# Patient Record
Sex: Female | Born: 1970 | Race: White | Hispanic: No | Marital: Married | Smoking: Never smoker
Health system: Southern US, Community
[De-identification: ages and names within clinical notes are randomized; demographics above are authoritative.]

## PROBLEM LIST (undated history)

## (undated) DIAGNOSIS — M7730 Calcaneal spur, unspecified foot: Secondary | ICD-10-CM

## (undated) DIAGNOSIS — F319 Bipolar disorder, unspecified: Secondary | ICD-10-CM

## (undated) DIAGNOSIS — F32A Depression, unspecified: Secondary | ICD-10-CM

## (undated) DIAGNOSIS — D649 Anemia, unspecified: Secondary | ICD-10-CM

## (undated) DIAGNOSIS — F329 Major depressive disorder, single episode, unspecified: Secondary | ICD-10-CM

## (undated) DIAGNOSIS — K219 Gastro-esophageal reflux disease without esophagitis: Secondary | ICD-10-CM

## (undated) DIAGNOSIS — M419 Scoliosis, unspecified: Secondary | ICD-10-CM

## (undated) DIAGNOSIS — M1990 Unspecified osteoarthritis, unspecified site: Secondary | ICD-10-CM

## (undated) DIAGNOSIS — R7303 Prediabetes: Secondary | ICD-10-CM

## (undated) DIAGNOSIS — J45909 Unspecified asthma, uncomplicated: Secondary | ICD-10-CM

## (undated) DIAGNOSIS — T7840XA Allergy, unspecified, initial encounter: Secondary | ICD-10-CM

## (undated) DIAGNOSIS — G43909 Migraine, unspecified, not intractable, without status migrainosus: Secondary | ICD-10-CM

## (undated) DIAGNOSIS — I10 Essential (primary) hypertension: Secondary | ICD-10-CM

## (undated) HISTORY — DX: Allergy, unspecified, initial encounter: T78.40XA

## (undated) HISTORY — DX: Calcaneal spur, unspecified foot: M77.30

## (undated) HISTORY — DX: Anemia, unspecified: D64.9

## (undated) HISTORY — PX: WISDOM TOOTH EXTRACTION: SHX21

## (undated) HISTORY — DX: Unspecified osteoarthritis, unspecified site: M19.90

## (undated) HISTORY — DX: Scoliosis, unspecified: M41.9

## (undated) HISTORY — DX: Depression, unspecified: F32.A

## (undated) HISTORY — DX: Major depressive disorder, single episode, unspecified: F32.9

## (undated) HISTORY — DX: Prediabetes: R73.03

---

## 2009-03-19 ENCOUNTER — Encounter: Admission: RE | Admit: 2009-03-19 | Discharge: 2009-03-19 | Payer: Self-pay | Admitting: Internal Medicine

## 2010-04-06 ENCOUNTER — Encounter: Payer: Self-pay | Admitting: *Deleted

## 2017-01-04 HISTORY — PX: RHINOPLASTY: SUR1284

## 2017-07-13 ENCOUNTER — Other Ambulatory Visit: Payer: Self-pay

## 2017-07-13 ENCOUNTER — Encounter (HOSPITAL_COMMUNITY): Payer: Self-pay | Admitting: Emergency Medicine

## 2017-07-13 ENCOUNTER — Emergency Department (HOSPITAL_COMMUNITY)
Admission: EM | Admit: 2017-07-13 | Discharge: 2017-07-13 | Disposition: A | Discharge status: Home / Self Care | Payer: Self-pay | Attending: Emergency Medicine | Admitting: Emergency Medicine

## 2017-07-13 DIAGNOSIS — F319 Bipolar disorder, unspecified: Secondary | ICD-10-CM | POA: Insufficient documentation

## 2017-07-13 DIAGNOSIS — J45909 Unspecified asthma, uncomplicated: Secondary | ICD-10-CM | POA: Insufficient documentation

## 2017-07-13 DIAGNOSIS — I10 Essential (primary) hypertension: Secondary | ICD-10-CM | POA: Insufficient documentation

## 2017-07-13 DIAGNOSIS — J4520 Mild intermittent asthma, uncomplicated: Secondary | ICD-10-CM

## 2017-07-13 DIAGNOSIS — B353 Tinea pedis: Secondary | ICD-10-CM

## 2017-07-13 HISTORY — DX: Unspecified asthma, uncomplicated: J45.909

## 2017-07-13 HISTORY — DX: Gastro-esophageal reflux disease without esophagitis: K21.9

## 2017-07-13 HISTORY — DX: Migraine, unspecified, not intractable, without status migrainosus: G43.909

## 2017-07-13 HISTORY — DX: Bipolar disorder, unspecified: F31.9

## 2017-07-13 HISTORY — DX: Essential (primary) hypertension: I10

## 2017-07-13 MED ORDER — CLOTRIMAZOLE 1 % EX CREA
TOPICAL_CREAM | Freq: Two times a day (BID) | CUTANEOUS | Status: DC
  Administered 2017-07-13: 03:00:00 via TOPICAL
  Filled 2017-07-13 (×2): qty 15

## 2017-07-13 MED ORDER — ALBUTEROL SULFATE HFA 108 (90 BASE) MCG/ACT IN AERS
2.0000 | INHALATION_SPRAY | RESPIRATORY_TRACT | Status: DC | PRN
  Administered 2017-07-13: 2 via RESPIRATORY_TRACT
  Filled 2017-07-13: qty 6.7

## 2017-07-13 MED ORDER — DOXEPIN HCL 10 MG PO CAPS: 150.0000 mg | ORAL_CAPSULE | Freq: Every day | ORAL | 6 refills | Status: DC

## 2017-07-13 MED ORDER — DOXEPIN HCL 10 MG PO CAPS: 10.0000 mg | ORAL_CAPSULE | Freq: Every day | ORAL | 6 refills | Status: DC

## 2017-07-13 NOTE — ED Provider Notes (Signed)
[facility] EMERGENCY DEPARTMENT Provider Note   CSN: [id number] Arrival date & time: 07/13/17  0144     History   Chief Complaint Chief Complaint  Patient presents with  . Medication Refill    HPI Carolyn Daniels is a 47 y.o. female.  Patient presents to the ER stating that she needs help obtaining her medications.  She recently moved from [place].  She has having difficulty purchasing her medications.  She does not currently have insurance.  She has been out of her doxepin.  She reports that she takes this for her bipolar and is concerned that she will have problems if she does not take it.  Also has a painful rash on the bottom of her left foot.     Past Medical History:  Diagnosis Date  . Asthma   . Bipolar 1 disorder (HCC)   . GERD (gastroesophageal reflux disease)   . Hypertension   . Migraines     There are no active problems to display for this patient.   Past Surgical History:  Procedure Laterality Date  . CESAREAN SECTION    . RHINOPLASTY  01/2017     OB History   None      Home Medications    Prior to Admission medications   Medication Sig Start Date End Date Taking? Authorizing Provider  doxepin (SINEQUAN) 10 MG capsule Take 1 capsule (10 mg total) by mouth at bedtime. 07/13/17   Gilda Crease, MD    Family History History reviewed. No pertinent family history.  Social History Social History   Tobacco Use  . Smoking status: Never Smoker  . Smokeless tobacco: Never Used  Substance Use Topics  . Alcohol use: Not Currently  . Drug use: Not Currently     Allergies   Augmentin [amoxicillin-pot clavulanate]; Depakote er [divalproex sodium er]; Lomotil [diphenoxylate]; Sudafed [pseudoephedrine hcl]; and Topamax [topiramate]   Review of Systems Review of Systems  Skin: Positive for rash.  All other systems reviewed and are negative.    Physical Exam Updated Vital Signs BP 131/74 (BP Location: Left Arm)   Pulse 65    Temp 97.8 F (36.6 C) (Oral)   Resp 20   Ht  (1.651 m)   Wt 90.7 kg (200 lb)   SpO2 99%   BMI 33.28 kg/m   Physical Exam  Constitutional: She is oriented to person, place, and time. She appears well-developed and well-nourished. No distress.  HENT:  Head: Normocephalic and atraumatic.  Right Ear: Hearing normal.  Left Ear: Hearing normal.  Nose: Nose normal.  Mouth/Throat: Oropharynx is clear and moist and mucous membranes are normal.  Eyes: Pupils are equal, round, and reactive to light. Conjunctivae and EOM are normal.  Neck: Normal range of motion. Neck supple.  Cardiovascular: Regular rhythm, S1 normal and S2 normal. Exam reveals no gallop and no friction rub.  No murmur heard. Pulmonary/Chest: Effort normal and breath sounds normal. No respiratory distress. She exhibits no tenderness.  Abdominal: Soft. Normal appearance and bowel sounds are normal. There is no hepatosplenomegaly. There is no tenderness. There is no rebound, no guarding, no tenderness at McBurney's point and negative Murphy's sign. No hernia.  Musculoskeletal: Normal range of motion.  Neurological: She is alert and oriented to person, place, and time. She has normal strength. No cranial nerve deficit or sensory deficit. Coordination normal. GCS eye subscore is 4. GCS verbal subscore is 5. GCS motor subscore is 6.  Skin: Skin is warm, dry and  intact. No rash noted. No cyanosis.  Slightly scab, raised, non-draining lesion plantar aspect of left foot  Psychiatric: She has a normal mood and affect. Her speech is normal and behavior is normal. Thought content normal.  Nursing note and vitals reviewed.    ED Treatments / Results  Labs (all labs ordered are listed, but only abnormal results are displayed) Labs Reviewed - No data to display  EKG None  Radiology No results found.  Procedures Procedures (including critical care time)  Medications Ordered in ED Medications  clotrimazole (LOTRIMIN) 1 %  cream (has no administration in time range)  albuterol (PROVENTIL HFA;VENTOLIN HFA) 108 (90 Base) MCG/ACT inhaler 2 puff (has no administration in time range)     Initial Impression / Assessment and Plan / ED Course  I have reviewed the triage vital signs and the nursing notes.  Pertinent labs & imaging results that were available during my care of the patient were reviewed by me and considered in my medical decision making (see chart for details).     Nonspecific rash on bottom of foot, suspect tinea pedis without signs of bacterial superinfection.  Patient has a history of asthma, no active problems, will provide inhaler.  Patient provided information about local pharmacies that might help her with her medications, prescribed her doxepin.  Final Clinical Impressions(s) / ED Diagnoses   Final diagnoses:  Bipolar 1 disorder (HCC)  Tinea pedis of left foot  Mild intermittent asthma, unspecified whether complicated    ED Discharge Orders        Ordered    doxepin (SINEQUAN) 10 MG capsule  Daily at bedtime,   Status:  Discontinued     07/13/17 0222    doxepin (SINEQUAN) 10 MG capsule  Daily at bedtime     07/13/17 0222       Gilda Crease, MD 07/13/17 [phone]

## 2017-07-13 NOTE — ED Triage Notes (Signed)
Pt states she has been taking Doxepine for 4 yrs and needs meds, pt states she does not have enough money to get her meds, pt states she has been diagnosed as Bi-polar and will get "off her rocker and go bonkers" without the meds

## 2017-09-25 ENCOUNTER — Emergency Department (HOSPITAL_COMMUNITY)
Admission: EM | Admit: 2017-09-25 | Discharge: 2017-09-25 | Disposition: A | Discharge status: Home / Self Care | Payer: Self-pay | Attending: Emergency Medicine | Admitting: Emergency Medicine

## 2017-09-25 DIAGNOSIS — J45909 Unspecified asthma, uncomplicated: Secondary | ICD-10-CM | POA: Insufficient documentation

## 2017-09-25 DIAGNOSIS — F4329 Adjustment disorder with other symptoms: Secondary | ICD-10-CM

## 2017-09-25 DIAGNOSIS — F3112 Bipolar disorder, current episode manic without psychotic features, moderate: Secondary | ICD-10-CM | POA: Insufficient documentation

## 2017-09-25 DIAGNOSIS — Z79899 Other long term (current) drug therapy: Secondary | ICD-10-CM | POA: Insufficient documentation

## 2017-09-25 DIAGNOSIS — E876 Hypokalemia: Secondary | ICD-10-CM | POA: Insufficient documentation

## 2017-09-25 DIAGNOSIS — F309 Manic episode, unspecified: Secondary | ICD-10-CM

## 2017-09-25 DIAGNOSIS — I10 Essential (primary) hypertension: Secondary | ICD-10-CM | POA: Insufficient documentation

## 2017-09-25 LAB — CBC
HEMATOCRIT: 41.7 % (ref 36.0–46.0)
HEMOGLOBIN: 13.6 g/dL (ref 12.0–15.0)
MCH: 30.8 pg (ref 26.0–34.0)
MCHC: 32.6 g/dL (ref 30.0–36.0)
MCV: 94.3 fL (ref 78.0–100.0)
Platelets: 407 10*3/uL — ABNORMAL HIGH (ref 150–400)
RBC: 4.42 MIL/uL (ref 3.87–5.11)
RDW: 12.7 % (ref 11.5–15.5)
WBC: 10.6 10*3/uL — AB (ref 4.0–10.5)

## 2017-09-25 LAB — COMPREHENSIVE METABOLIC PANEL
ALK PHOS: 97 U/L (ref 38–126)
ALT: 19 U/L (ref 0–44)
AST: 16 U/L (ref 15–41)
Albumin: 4.5 g/dL (ref 3.5–5.0)
Anion gap: 12 (ref 5–15)
BILIRUBIN TOTAL: 0.6 mg/dL (ref 0.3–1.2)
BUN: 16 mg/dL (ref 6–20)
CO2: 29 mmol/L (ref 22–32)
Calcium: 9.9 mg/dL (ref 8.9–10.3)
Chloride: 101 mmol/L (ref 98–111)
Creatinine, Ser: 0.83 mg/dL (ref 0.44–1.00)
GFR calc Af Amer: >60 mL/min (ref >60)
GFR calc non Af Amer: >60 mL/min (ref >60)
GLUCOSE: 109 mg/dL — AB (ref 70–99)
POTASSIUM: 3 mmol/L — AB (ref 3.5–5.1)
SODIUM: 142 mmol/L (ref 135–145)
TOTAL PROTEIN: 8 g/dL (ref 6.5–8.1)

## 2017-09-25 LAB — RAPID URINE DRUG SCREEN, HOSP PERFORMED
AMPHETAMINES: NOT DETECTED
BARBITURATES: NOT DETECTED
BENZODIAZEPINES: NOT DETECTED
COCAINE: NOT DETECTED
Opiates: NOT DETECTED
TETRAHYDROCANNABINOL: NOT DETECTED

## 2017-09-25 LAB — SALICYLATE LEVEL: Salicylate Lvl: <7 mg/dL (ref 2.8–30.0)

## 2017-09-25 LAB — I-STAT BETA HCG BLOOD, ED (MC, WL, AP ONLY)

## 2017-09-25 LAB — ACETAMINOPHEN LEVEL

## 2017-09-25 LAB — ETHANOL: Alcohol, Ethyl (B): <10 mg/dL (ref <10)

## 2017-09-25 MED ORDER — RISPERIDONE 1 MG PO TABS: 1.0000 mg | ORAL_TABLET | Freq: Every day | ORAL | Status: DC

## 2017-09-25 MED ORDER — ESCITALOPRAM OXALATE 10 MG PO TABS
20.0000 mg | ORAL_TABLET | Freq: Every day | ORAL | Status: DC
  Administered 2017-09-25: 20 mg via ORAL
  Filled 2017-09-25: qty 2

## 2017-09-25 MED ORDER — LORAZEPAM 1 MG PO TABS
1.0000 mg | ORAL_TABLET | ORAL | Status: DC | PRN
  Administered 2017-09-25: 1 mg via ORAL
  Filled 2017-09-25: qty 1

## 2017-09-25 MED ORDER — POTASSIUM CHLORIDE CRYS ER 20 MEQ PO TBCR
40.0000 meq | EXTENDED_RELEASE_TABLET | Freq: Once | ORAL | Status: AC
  Administered 2017-09-25: 40 meq via ORAL
  Filled 2017-09-25: qty 2

## 2017-09-25 MED ORDER — HYDROCHLOROTHIAZIDE 12.5 MG PO CAPS
12.5000 mg | ORAL_CAPSULE | Freq: Every day | ORAL | Status: DC
  Administered 2017-09-25: 12.5 mg via ORAL
  Filled 2017-09-25: qty 1

## 2017-09-25 NOTE — [facility] Suicide Risk Assessment (Cosign Needed Addendum)
Suicide Risk Assessment  Discharge Assessment   [facility]   Principal Problem: Adjustment disorder with emotional disturbance Discharge Diagnoses:  Patient Active Problem List   Diagnosis Date Noted  . Adjustment disorder with emotional disturbance [F43.29] 09/25/2017  ID: Lives with her husband of 5.5 years. States this is his her 3 rd marriage. She has a set of twins (18) from previous marriage. She is unemployed at this time and endorse financial problems at this time.   CC: I needed to get meds and I was hungry. My car wouldn't start. She denies past psychiatric istory, inpatient, outpatient, suicide attempts. She denies family hsitory. She denies previous drug use. UDS negative.   Total Time spent with patient: 20 minutes  Musculoskeletal: Strength & Muscle Tone: within normal limits Gait & Station: normal Patient leans: N/A  Psychiatric Specialty Exam:   Blood pressure 137/77, pulse 81, temperature 97.7 F (36.5 C), temperature source Oral, resp. rate 19, SpO2 99 %.There is no height or weight on file to calculate BMI.  General Appearance: Fairly Groomed  Patent attorneyye Contact::  fair,   Speech:  Clear and Coherent and Pressured  Volume:  Normal  Mood:  better  Affect:  Appropriate and Congruent  Thought Process:  Coherent, Linear and Descriptions of Associations: Circumstantial  Orientation:  Full (Time, Place, and Person)  Thought Content:  Logical, Rumination and Tangential  Suicidal Thoughts:  No  Homicidal Thoughts:  No  Memory:  Immediate;   Good Recent;   Good  Judgement:  Fair  Insight:  Fair  Psychomotor Activity:  Normal  Concentration:  Good  Recall:  Good  Fund of Knowledge:Good  Language: Good  Akathisia:  No  Handed:  Right  AIMS (if indicated):     Assets:  Communication Skills Desire for Improvement Housing Leisure Time Physical Health Transportation Vocational/Educational  Sleep:     Cognition: WNL  ADL's:  Intact    Mental Status Per Nursing Assessment::   On Admission:     Demographic Factors:  Caucasian and Low socioeconomic status  Loss Factors: Decrease in vocational status and Financial problems/change in socioeconomic status  Historical Factors: NA  Risk Reduction Factors:   Sense of responsibility to family, Living with another person, especially a relative and Positive coping skills or problem solving skills  Continued Clinical Symptoms:  Depression:   Impulsivity Insomnia More than one psychiatric diagnosis Previous Psychiatric Diagnoses and Treatments  Cognitive Features That Contribute To Risk:  None    Suicide Risk:  Minimal: No identifiable suicidal ideation.  Patients presenting with no risk factors but with morbid ruminations; may be classified as minimal risk based on the severity of the depressive symptoms   Plan Of Care/Follow-up recommendations:  Other:  Continue taking medications as directed. See information for referral to therapist.   Truman Haywardakia S Starkes, FNP 09/25/2017, 12:15 PM

## 2017-09-25 NOTE — BH Assessment (Addendum)
Assessment Note  Carolyn Daniels is an 47 y.o. female.  The pt came in after making statements to 911 that she want's to kill her exhusband and herself.  The pt stated she said that because, she was hungry and didn't have any money.  The pt lives in [place], [place] and drove down to [place] after being told that someone picked up her husband's medication from a [facility] in [place].  The pt came to [facility] to [facility] to get the medication.  The pt eventually went to [facility] and stated her car stopped working.  The pt stated she did not have any money on any of her cards, so she called her ex husband for assistance.  She became angry after he refused to help her.  The pt was assessed at 0610 and she is very alert and was laughing inappropriately, such as laughing about suicide. Before and after being assessed, the pt was in her doorway talking to people. According to the pt she sleeps from 0800-1500 due to taking care of her disabled husband.  The pt does not appear drowsy at this time. The pt continues to deny SI and HI.  The pt has a psychiatrist in [place], [place] and denies having a counselor currently.  She denies ever being inpatient.  She denies every having SI and self harm.  She also denies HI, legal issues, history of abuse and hallucinations.  The pt stated she has a good appetite.  She denies depressive symptoms.  She denies any past or present substance use.   Pt is dressed in scrubs. She is alert and oriented x4. Pt speaks in a clear tone, at moderate volume and normal pace.  The pt is tangential and laughs inappropriately. Eye contact is good. Pt's mood is euphoric. There is no indication Pt is currently responding to internal stimuli or experiencing delusional thought content.?Pt was cooperative throughout assessment.    Diagnosis: F31.12 Bipolar I disorder, Current or most recent episode manic, Moderate   Past Medical History:  Past Medical History:  Diagnosis Date  . Asthma   . Bipolar 1 disorder  (HCC)   . GERD (gastroesophageal reflux disease)   . Hypertension   . Migraines     Past Surgical History:  Procedure Laterality Date  . CESAREAN SECTION    . RHINOPLASTY  01/2017    Family History: No family history on file.  Social History:  reports that she has never smoked. She has never used smokeless tobacco. She reports that she drank alcohol. She reports that she has current or past drug history.  Additional Social History:  Alcohol / Drug Use Pain Medications: See MAR Prescriptions: See MAR Over the Counter: See MAR History of alcohol / drug use?: No history of alcohol / drug abuse Longest period of sobriety (when/how long): NA  CIWA: CIWA-Ar BP: 137/77 Pulse Rate: 81 COWS:    Allergies:  Allergies  Allergen Reactions  . Augmentin [Amoxicillin-Pot Clavulanate] Other (See Comments)    c diff  . Depakote Er [Divalproex Sodium Er] Other (See Comments)    "extreme gas pain"  . Lamictal [Lamotrigine] Hives  . Sudafed [Pseudoephedrine Hcl] Other (See Comments)    Tachycardia   . Topamax [Topiramate] Other (See Comments)    Nose bleeds    Home Medications:  (Not in a hospital admission)  OB/GYN Status:  No LMP recorded.  General Assessment Data Location of Assessment: [facility] ED TTS Assessment: In system Is this a Tele or Face-to-Face Assessment?:  Face-to-Face Is this an Initial Assessment or a Re-assessment for this encounter?: Initial Assessment Marital status: Married [place]iden name: Corine ShelterWatkins Is patient pregnant?: No Pregnancy Status: No Living Arrangements: Spouse/significant other Can pt return to current living arrangement?: Yes Admission Status: Involuntary Is patient capable of signing voluntary admission?: No(Pt IVC) Referral Source: Self/Family/Friend Insurance type: Self Pay  Medical Screening Exam [facility] Walk-in ONLY) Medical Exam completed: Yes  Crisis Care Plan Living Arrangements: Spouse/significant other Legal Guardian: Other:(Self) Name of  Psychiatrist: Dr. Alessandra [place]sVaughn in [place] Name of Therapist: none  Education Status Is patient currently in school?: No Is the patient employed, unemployed or receiving disability?: ("self employed")  Risk to self with the past 6 months Suicidal Ideation: No Has patient been a risk to self within the past 6 months prior to admission? : No Suicidal Intent: No Has patient had any suicidal intent within the past 6 months prior to admission? : No Is patient at risk for suicide?: No Suicidal Plan?: No Has patient had any suicidal plan within the past 6 months prior to admission? : No Access to Means: No What has been your use of drugs/alcohol within the last 12 months?: none Previous Attempts/Gestures: No How many times?: 0 Other Self Harm Risks: none Triggers for Past Attempts: None known Intentional Self Injurious Behavior: None Family Suicide History: No Recent stressful life event(s): Loss (Comment), Conflict (Comment)(lack of money and argument with ex husband) Persecutory voices/beliefs?: No Depression: No Substance abuse history and/or treatment for substance abuse?: No Suicide prevention information given to non-admitted patients: Not applicable  Risk to Others within the past 6 months Homicidal Ideation: No Does patient have any lifetime risk of violence toward others beyond the six months prior to admission? : No Thoughts of Harm to Others: No Current Homicidal Intent: No Current Homicidal Plan: No Access to Homicidal Means: No Identified Victim: none History of harm to others?: No Assessment of Violence: None Noted Violent Behavior Description: none Does patient have access to weapons?: No Criminal Charges Pending?: No Does patient have a court date: No Is patient on probation?: No  Psychosis Hallucinations: None noted Delusions: None noted  Mental Status Report Appearance/Hygiene: In scrubs, Unremarkable Eye Contact: Good Motor Activity: Freedom of movement,  Unremarkable Speech: Logical/coherent Level of Consciousness: Alert Mood: Euphoric Affect: Euphoric Anxiety Level: None Thought Processes: Coherent, Relevant, Tangential Judgement: Impaired Orientation: Person, Place, Time, Situation Obsessive Compulsive Thoughts/Behaviors: None  Cognitive Functioning Concentration: Normal Memory: Recent Intact, Remote Intact Is patient IDD: No Is patient DD?: No Insight: Poor Impulse Control: Poor Appetite: Good Have you had any weight changes? : No Change Sleep: Unable to Assess Total Hours of Sleep: 8(per pt's report  Pt doesn't appear sleepy & hasn't slept ) Vegetative Symptoms: None  ADLScreening [facility] Assessment Services) Patient's cognitive ability adequate to safely complete daily activities?: Yes Patient able to express need for assistance with ADLs?: Yes Independently performs ADLs?: Yes (appropriate for developmental age)  Prior Inpatient Therapy Prior Inpatient Therapy: No  Prior Outpatient Therapy Prior Outpatient Therapy: Yes Prior Therapy Dates: current Prior Therapy Facilty/Provider(s): Dr. Alessandra BevelsVaughn in [place] Reason for Treatment: bipolar disorder Does patient have an ACCT team?: No Does patient have Intensive In-House Services?  : No Does patient have [facility] services? : No Does patient have P4CC services?: No  ADL Screening (condition at time of admission) Patient's cognitive ability adequate to safely complete daily activities?: Yes Patient able to express need for assistance with ADLs?: Yes Independently performs ADLs?: Yes (appropriate for developmental age)  Abuse/Neglect Assessment (Assessment to be complete while patient is alone) Abuse/Neglect Assessment Can Be Completed: Yes Physical Abuse: Denies Verbal Abuse: Denies Sexual Abuse: Denies Exploitation of patient/patient's resources: Denies Self-Neglect: Denies Values / Beliefs Cultural Requests During Hospitalization: None Spiritual Requests During  Hospitalization: None Consults Spiritual Care Consult Needed: No Social Work Consult Needed: No            Disposition:  Disposition Initial Assessment Completed for this Encounter: Yes   NP Nira Conn recommends the pt be observed overnight for safety and stabilization.  The pt is to be reassessed by psychiatry in the morning.  RN Junior and MD Bebe Shaggy were made aware of the recommendation.   On Site Evaluation by:   Reviewed with Physician:    Ottis Stain 09/25/2017 6:35 AM

## 2017-09-25 NOTE — ED Notes (Signed)
Bed: WA30 Expected date:  Expected time:  Means of arrival:  Comments: EMS SI 

## 2017-09-25 NOTE — ED Triage Notes (Addendum)
Per [employer] patient called them stated she wants to kill herself and her husband. Per [employer] pt was been in [facility] for 12 hours. Patient stated "I just said Im suicidal because I don't have money and Im hungry".Patient IVC'd by [employer]

## 2017-09-25 NOTE — Discharge Instructions (Signed)
For your behavioral health needs, you are advised to [facility] with [facility]:       [facility]      [address]      [place], [place] [address]      [phone]

## 2017-09-25 NOTE — Progress Notes (Addendum)
CSW met with patient regarding disposition plan. Patient has been psychiatrically cleared and is ready to discharge today. Patient currently lives in [place], [place] but her car is still at a [employer] in [place], [place] and is out of gas. Patient states that she has family that can lend her some money over 35 to fill up her car. Patient states she does not have anyone who can pick her up and take her to her car. CSW informed patient that she can provide her with a bus pass. Patient states that in the event that she is unable to fill up her car she can reach out to the pastor of her church who can come pick her up. CSW has left bus pass with RN.  Ollen Barges, [place] Work Department  [employer]  [phone]

## 2017-09-25 NOTE — ED Provider Notes (Signed)
[facility]-EMERGENCY DEPT Provider Note   CSN: [id number] Arrival date & time: 09/25/17  0446     History   Chief Complaint Chief Complaint  Patient presents with  . Suicidal  . IVC   Level 5 caveat due to psychiatric disorder HPI Carol[facility] is a 47 y.o. female.  The history is provided by the patient.  Mental Health Problem  Presenting symptoms: bizarre behavior, disorganized speech and suicidal threats   Degree of incapacity (severity):  Severe Onset quality:  Gradual Timing:  Constant Progression:  Worsening Chronicity:  New Relieved by:  Nothing Worsened by:  Nothing Associated symptoms: no headaches   patient with history of bipolar presents for evaluation. Apparently she was staying at a local pharmacy for several hours because she could not get her ride home.  She then reported that she was suicidal and did not have any money and she was hungry.  This was reason police were called and she was brought to the emergency department for evaluation  When I enter the room, patient is smiling and laughing.  She asked me if I understood what " the straw that broke the camel's back" means.  She also asked me about religion.  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  Past Medical History:  Diagnosis Date  . Asthma   . Bipolar 1 disorder (HCC)   . GERD (gastroesophageal reflux disease)   . Hypertension   . Migraines     There are no active problems to display for this patient.   Past Surgical History:  Procedure Laterality Date  . CESAREAN SECTION    . RHINOPLASTY  01/2017     OB History   None      Home Medications    Prior to Admission medications   Medication Sig Start Date End Date Taking? Authorizing Provider  cetirizine (ZYRTEC) 10 MG tablet Take 10 mg by mouth daily.   Yes [provider]  cholecalciferol (VITAMIN D) 1000 units tablet Take 1,000 Units by mouth daily.   Yes  [provider]  Cyanocobalamin (VITAMIN B 12 PO) Take 1 tablet by mouth daily.   Yes [provider]  escitalopram (LEXAPRO) 20 MG tablet Take 20 mg by mouth daily.   Yes [provider]  hydrochlorothiazide (MICROZIDE) 12.5 MG capsule Take 12.5 mg by mouth daily.   Yes [provider]  losartan (COZAAR) 25 MG tablet Take 25 mg by mouth daily.   Yes [provider]  montelukast (SINGULAIR) 10 MG tablet Take 10 mg by mouth at bedtime.   Yes [provider]  Multiple Vitamin (MULTIVITAMIN WITH MINERALS) TABS tablet Take 1 tablet by mouth daily.   Yes [provider]  propranolol ER (INDERAL LA) 80 MG 24 hr capsule Take 80 mg by mouth daily.   Yes [provider]  ranitidine (ZANTAC) 300 MG tablet Take 600 mg by mouth 2 (two) times daily.   Yes [provider]  risperiDONE (RISPERDAL) 1 MG tablet Take 1 mg by mouth at bedtime.   Yes [provider]  doxepin (SINEQUAN) 10 MG capsule Take 1 capsule (10 mg total) by mouth at bedtime. Patient not taking: Reported on 09/25/2017 07/13/17   Gilda Crease, MD    Family History No family history on file.  Social History Social History   Tobacco Use  . Smoking status: Never Smoker  . Smokeless tobacco: Never Used  Substance Use Topics  . Alcohol use: Not Currently  . Drug use: Not Currently     Allergies   Augmentin [amoxicillin-pot clavulanate]; Depakote er [divalproex sodium er]; Lamictal [lamotrigine]; Sudafed [pseudoephedrine hcl]; and Topamax [topiramate]   Review of Systems Review of Systems  Unable to perform ROS: Psychiatric disorder  Neurological: Negative for headaches.     Physical Exam Updated Vital Signs BP 137/77 (BP Location: Right Arm)   Pulse 81   Temp 97.7 F (36.5 C) (Oral)   Resp 19   SpO2 99%   Physical Exam CONSTITUTIONAL: Disheveled, smiling and pacing around the room HEAD: Normocephalic/atraumatic EYES:  EOMI ENMT: Mucous membranes moist NECK: supple no meningeal signs CV: S1/S2 noted, no murmurs/rubs/gallops noted LUNGS: Lungs are clear to auscultation bilaterally, no apparent distress ABDOMEN: soft NEURO: Pt is awake/alert moves all extremitiesx4.  No facial droop.  She is ambulatory EXTREMITIES:  full ROM SKIN: warm, color normal PSYCH: Patient is anxious, smiling and laughing inappropriately.  She has pressured speech and flight of ideas  ED Treatments / Results  Labs (all labs ordered are listed, but only abnormal results are displayed) Labs Reviewed  COMPREHENSIVE METABOLIC PANEL - Abnormal; Notable for the following components:      Result Value   Potassium 3.0 (*)    Glucose, Bld 109 (*)    All other components within normal limits  ACETAMINOPHEN LEVEL - Abnormal; Notable for the following components:   Acetaminophen (Tylenol), Serum <10 (*)    All other components within normal limits  CBC - Abnormal; Notable for the following components:   WBC 10.6 (*)    Platelets 407 (*)    All other components within normal limits  ETHANOL  SALICYLATE LEVEL  RAPID URINE DRUG SCREEN, HOSP PERFORMED  I-STAT BETA HCG BLOOD, ED ([facility], [facility], [facility] ONLY)    EKG None  Radiology No results found.  Procedures Procedures   Medications Ordered in ED Medications  escitalopram (LEXAPRO) tablet 20 mg (has no administration in time range)  hydrochlorothiazide (MICROZIDE) capsule 12.5 mg (has no administration in time range)  risperiDONE (RISPERDAL) tablet 1 mg (has no administration in time range)  LORazepam (ATIVAN) tablet 1 mg (1 mg Oral Given 09/25/17 0616)  potassium chloride SA (K-DUR,KLOR-CON) CR tablet 40 mEq (40 mEq Oral Given 09/25/17 [id number])     Initial Impression / Assessment and Plan / ED Course  I have reviewed the triage vital signs and the nursing notes.  Pertinent labs  results that were available during my care of the patient were reviewed by me and considered in my medical  decision making (see chart for details).     6:44 AM Strong suspicion this represents a manic episode.  Home medications have been ordered.  Mild hypokalemia noted, she has been ordered to have potassium.  Patient will be evaluated by psychiatry.  Patient medically stable  Final Clinical Impressions(s) / ED Diagnoses   Final diagnoses:  [facility](HCC)    ED Discharge Orders    None       Zadie RhineWickline, Adison Reifsteck, MD 09/25/17 [id number]

## 2017-09-25 NOTE — ED Notes (Signed)
Bed: WBH35 Expected date:  Expected time:  Means of arrival:  Comments: Hold for room 30 

## 2017-09-25 NOTE — BH Assessment (Signed)
[name]      Per Dr Sharma CovertNorman, patient can be discharged home to follow up with outpatient resources.

## 2017-09-25 NOTE — ED Notes (Signed)
On admission to the Acute Unit pt is cheerful and cooperative.

## 2017-09-25 NOTE — BH Assessment (Signed)
[facility]  Per Juanetta BeetsJacqueline [name], DO, this pt does not require psychiatric hospitalization at this time.  Pt presents under IVC initiated by law enforcement, which Dr Sharma CovertNorman has rescinded.  [facility] [place].  This has been included in pt's discharge instructions.  Thea SilversmithMackenzie, LCSW agrees to address pt's transportation needs to return home.  Pt's nurse, Diane, has been notified.  Doylene Canninghomas Vernel Donlan, MA Triage Specialist [phone]

## 2017-09-25 NOTE — ED Notes (Signed)
Pt discharged home. Discharged instructions read to pt who verbalized understanding. All belongings returned to pt who signed for same. Denies SI/HI, is not delusional and not responding to internal stimuli. Escorted pt to the ED exit.  Pt given bus pass. 
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
## 2017-09-25 NOTE — ED Notes (Signed)
BELONGINGS INCLUDE DENIM & BROWN PURSE; BLACK SHIRT; 1 BRA; BLUE SANDALS; COLORFUL SHORTS; VARIOUS CARDS IN CARD CASES; 1 EMPTY BLACK WALLET; 1 BLACK PHONE; MIRROR; KEYS; BOTTLE OF FENOFIBRATE 145 MG; BLACK & SILVER COLOR EARRINGS,  1 GOLD COLOR WATCH, 8 RING'S, 1 INHALER, 1 SILVER COLOR BRACELET. ALL IN LOCKER 35.

## 2017-11-26 ENCOUNTER — Ambulatory Visit: Payer: Self-pay | Admitting: Physician Assistant

## 2017-12-03 ENCOUNTER — Ambulatory Visit: Payer: Medicaid Other | Admitting: Physician Assistant

## 2017-12-03 ENCOUNTER — Encounter: Payer: Self-pay | Admitting: Physician Assistant

## 2017-12-03 VITALS — BP 124/77 | HR 88 | Temp 97.5°F | Ht 64.25 in | Wt 191.0 lb

## 2017-12-03 DIAGNOSIS — Z8639 Personal history of other endocrine, nutritional and metabolic disease: Secondary | ICD-10-CM

## 2017-12-03 DIAGNOSIS — Z1239 Encounter for other screening for malignant neoplasm of breast: Secondary | ICD-10-CM

## 2017-12-03 DIAGNOSIS — R7989 Other specified abnormal findings of blood chemistry: Secondary | ICD-10-CM

## 2017-12-03 DIAGNOSIS — I10 Essential (primary) hypertension: Secondary | ICD-10-CM | POA: Insufficient documentation

## 2017-12-03 DIAGNOSIS — Z131 Encounter for screening for diabetes mellitus: Secondary | ICD-10-CM

## 2017-12-03 DIAGNOSIS — F319 Bipolar disorder, unspecified: Secondary | ICD-10-CM | POA: Insufficient documentation

## 2017-12-03 DIAGNOSIS — G43C0 Periodic headache syndromes in child or adult, not intractable: Secondary | ICD-10-CM

## 2017-12-03 DIAGNOSIS — E876 Hypokalemia: Secondary | ICD-10-CM

## 2017-12-03 DIAGNOSIS — Z7689 Persons encountering health services in other specified circumstances: Secondary | ICD-10-CM

## 2017-12-03 DIAGNOSIS — Z13220 Encounter for screening for lipoid disorders: Secondary | ICD-10-CM

## 2017-12-03 MED ORDER — LOSARTAN POTASSIUM 25 MG PO TABS: 25.0000 mg | ORAL_TABLET | Freq: Every day | ORAL | 1 refills | Status: DC
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
## 2017-12-03 NOTE — Patient Instructions (Signed)
For [place] [facility]: -proof of address -spouse's Notice of Award -Spouse's most recent benefit letter

## 2017-12-10 ENCOUNTER — Other Ambulatory Visit: Payer: Self-pay | Admitting: Physician Assistant

## 2017-12-10 MED ORDER — MONTELUKAST SODIUM 10 MG PO TABS: 10.0000 mg | ORAL_TABLET | Freq: Every day | ORAL | 1 refills | Status: DC

## 2017-12-20 ENCOUNTER — Other Ambulatory Visit (HOSPITAL_COMMUNITY)
Admission: RE | Admit: 2017-12-20 | Discharge: 2017-12-20 | Disposition: A | Discharge status: Home / Self Care | Payer: Medicaid Other | Source: Ambulatory Visit | Attending: Physician Assistant | Admitting: Physician Assistant

## 2017-12-20 DIAGNOSIS — E876 Hypokalemia: Secondary | ICD-10-CM

## 2017-12-20 DIAGNOSIS — R7989 Other specified abnormal findings of blood chemistry: Secondary | ICD-10-CM

## 2017-12-20 DIAGNOSIS — Z13220 Encounter for screening for lipoid disorders: Secondary | ICD-10-CM

## 2017-12-20 DIAGNOSIS — I10 Essential (primary) hypertension: Secondary | ICD-10-CM

## 2017-12-20 DIAGNOSIS — Z8639 Personal history of other endocrine, nutritional and metabolic disease: Secondary | ICD-10-CM

## 2017-12-20 DIAGNOSIS — Z131 Encounter for screening for diabetes mellitus: Secondary | ICD-10-CM

## 2017-12-20 LAB — LIPID PANEL
CHOL/HDL RATIO: 4.2 ratio
CHOLESTEROL: 211 mg/dL — AB (ref 0–200)
HDL: 50 mg/dL (ref >40)
LDL Cholesterol: 140 mg/dL — ABNORMAL HIGH (ref 0–99)
Triglycerides: 106 mg/dL (ref <150)
VLDL: 21 mg/dL (ref 0–40)

## 2017-12-20 LAB — COMPREHENSIVE METABOLIC PANEL
ALT: 20 U/L (ref 0–44)
AST: 19 U/L (ref 15–41)
Albumin: 4.2 g/dL (ref 3.5–5.0)
Alkaline Phosphatase: 79 U/L (ref 38–126)
Anion gap: 9 (ref 5–15)
BILIRUBIN TOTAL: 0.7 mg/dL (ref 0.3–1.2)
BUN: 10 mg/dL (ref 6–20)
CO2: 24 mmol/L (ref 22–32)
Calcium: 9.1 mg/dL (ref 8.9–10.3)
Chloride: 102 mmol/L (ref 98–111)
Creatinine, Ser: 0.69 mg/dL (ref 0.44–1.00)
Glucose, Bld: 113 mg/dL — ABNORMAL HIGH (ref 70–99)
POTASSIUM: 3.6 mmol/L (ref 3.5–5.1)
Sodium: 135 mmol/L (ref 135–145)
TOTAL PROTEIN: 7.5 g/dL (ref 6.5–8.1)

## 2017-12-20 LAB — CBC
HCT: 42.2 % (ref 36.0–46.0)
HEMOGLOBIN: 13.7 g/dL (ref 12.0–15.0)
MCH: 30.3 pg (ref 26.0–34.0)
MCHC: 32.5 g/dL (ref 30.0–36.0)
MCV: 93.4 fL (ref 80.0–100.0)
Platelets: 421 10*3/uL — ABNORMAL HIGH (ref 150–400)
RBC: 4.52 MIL/uL (ref 3.87–5.11)
RDW: 12.1 % (ref 11.5–15.5)
WBC: 9.2 10*3/uL (ref 4.0–10.5)
nRBC: 0 % (ref 0.0–0.2)

## 2017-12-20 LAB — HEMOGLOBIN A1C
HEMOGLOBIN A1C: 5.8 % — AB (ref 4.8–5.6)
MEAN PLASMA GLUCOSE: 119.76 mg/dL

## 2017-12-21 LAB — VITAMIN D 25 HYDROXY (VIT D DEFICIENCY, FRACTURES): Vit D, 25-Hydroxy: 25.7 ng/mL — ABNORMAL LOW (ref 30.0–100.0)

## 2017-12-29 ENCOUNTER — Other Ambulatory Visit: Payer: Self-pay

## 2017-12-29 ENCOUNTER — Encounter (HOSPITAL_COMMUNITY): Payer: Self-pay

## 2017-12-29 DIAGNOSIS — Z79899 Other long term (current) drug therapy: Secondary | ICD-10-CM | POA: Insufficient documentation

## 2017-12-29 DIAGNOSIS — G4489 Other headache syndrome: Secondary | ICD-10-CM | POA: Insufficient documentation

## 2017-12-29 DIAGNOSIS — J45909 Unspecified asthma, uncomplicated: Secondary | ICD-10-CM | POA: Insufficient documentation

## 2017-12-29 DIAGNOSIS — I10 Essential (primary) hypertension: Secondary | ICD-10-CM | POA: Insufficient documentation

## 2017-12-29 NOTE — ED Triage Notes (Signed)
Pt reports she always has headaches, but was on Propanolol and felt that it controlled headaches to a tolerable level, however has not taken since June due to financial reasons. Pt says headache today has gotten worse, no improvement with tylenol.

## 2017-12-30 ENCOUNTER — Emergency Department (HOSPITAL_COMMUNITY)
Admission: EM | Admit: 2017-12-30 | Discharge: 2017-12-30 | Disposition: A | Discharge status: Home / Self Care | Payer: Medicaid Other | Attending: Emergency Medicine | Admitting: Emergency Medicine

## 2017-12-30 DIAGNOSIS — G4489 Other headache syndrome: Secondary | ICD-10-CM

## 2017-12-30 MED ORDER — PROPRANOLOL HCL 10 MG PO TABS: 60.0000 mg | ORAL_TABLET | Freq: Once | ORAL | Status: DC

## 2017-12-30 MED ORDER — PROPRANOLOL HCL ER 60 MG PO CP24: 60.0000 mg | ORAL_CAPSULE | Freq: Every day | ORAL | 0 refills | Status: DC

## 2017-12-30 MED ORDER — PROPRANOLOL HCL 10 MG PO TABS
20.0000 mg | ORAL_TABLET | Freq: Once | ORAL | Status: AC
  Administered 2017-12-30: 20 mg via ORAL
  Filled 2017-12-30: qty 2

## 2017-12-30 NOTE — ED Provider Notes (Signed)
[facility] EMERGENCY DEPARTMENT Provider Note   CSN: [id number] Arrival date & time: 12/29/17  2132     History   Chief Complaint Chief Complaint  Patient presents with  . Headache    HPI Carolyn Daniels is a 47 y.o. female.  The history is provided by the patient.  Headache   This is a new problem. The current episode started 3 to 5 hours ago. The problem occurs constantly. The problem has been gradually improving. The headache is associated with nothing. The pain is located in the frontal region. The quality of the pain is described as throbbing. The pain is moderate. The pain does not radiate. Pertinent negatives include no fever and no vomiting.   Patient presents for headache.  She reports a long history of migraines, usually has 1 each month.  This episode started several hours ago.  It is in the frontal region, no fevers or vomiting. The headache was sudden onset, similar to prior episodes.  She used to take propranolol for migraines, but reports it has become too expensive. No new vision changes.  No new weakness. No other acute symptoms at this time Past Medical History:  Diagnosis Date  . Allergy   . Anemia   . Arthritis   . Asthma   . Bipolar 1 disorder (HCC)   . Depression   . GERD (gastroesophageal reflux disease)   . Heel spur   . Hypertension   . Migraines   . Migraines   . Pre-diabetes   . Scoliosis     Patient Active Problem List   Diagnosis Date Noted  . Essential hypertension 12/03/2017  . Bipolar affective disorder (HCC) 12/03/2017  . Adjustment disorder with emotional disturbance 09/25/2017    Past Surgical History:  Procedure Laterality Date  . CESAREAN SECTION    . RHINOPLASTY  01/2017  . WISDOM TOOTH EXTRACTION Bilateral      OB History   None      Home Medications    Prior to Admission medications   Medication Sig Start Date End Date Taking? Authorizing Provider  cetirizine (ZYRTEC) 10 MG tablet Take 10 mg by mouth  daily.    [provider]  hydrochlorothiazide (MICROZIDE) 12.5 MG capsule Take 12.5 mg by mouth daily.    [provider]  losartan (COZAAR) 25 MG tablet Take 1 tablet (25 mg total) by mouth daily. 12/03/17   Jacquelin Hawking, PA-C  montelukast (SINGULAIR) 10 MG tablet Take 10 mg by mouth at bedtime.    [provider]  montelukast (SINGULAIR) 10 MG tablet Take 1 tablet (10 mg total) by mouth at bedtime. 12/10/17   Jacquelin Hawking, PA-C  Multiple Vitamin (MULTIVITAMIN WITH MINERALS) TABS tablet Take 1 tablet by mouth daily.    [provider]  Probiotic Product (PROBIOTIC DAILY PO) Take 1 tablet by mouth daily.    [provider]  propranolol ER (INDERAL LA) 60 MG 24 hr capsule Take 1 capsule (60 mg total) by mouth daily. 12/30/17   Zadie Rhine, MD  risperiDONE (RISPERDAL) 1 MG tablet Take 1 mg by mouth at bedtime.    [provider]    Family History Family History  Problem Relation Age of Onset  . Stroke Maternal Grandfather   . Hypertension Maternal Grandfather     Social History Social History   Tobacco Use  . Smoking status: Never Smoker  . Smokeless tobacco: Never Used  Substance Use Topics  . Alcohol use: Not Currently  Comment: occasionally  . Drug use: Not Currently     Allergies   Augmentin [amoxicillin-pot clavulanate]; Depakote er [divalproex sodium er]; Lamictal [lamotrigine]; Sudafed [pseudoephedrine hcl]; and Topamax [topiramate]   Review of Systems Review of Systems  Constitutional: Negative for fever.  Eyes: Negative for visual disturbance.  Gastrointestinal: Negative for vomiting.  Neurological: Positive for headaches. Negative for weakness.  All other systems reviewed and are negative.    Physical Exam Updated Vital Signs BP 139/76 (BP Location: Right Arm)   Pulse 79   Temp 97.9 F (36.6 C) (Oral)   Resp 17   Ht 1.651 m (5\' 5" )   Wt 86.2 kg   LMP 12/18/2017 (Approximate)   SpO2 100%    BMI 31.62 kg/m   Physical Exam CONSTITUTIONAL: Well developed/well nourished HEAD: Normocephalic/atraumatic EYES: EOMI/PERRL, no nystagmus, no ptosis ENMT: Mucous membranes moist NECK: supple no meningeal signs, no bruits SPINE/BACK:entire spine nontender CV: S1/S2 noted, no murmurs/rubs/gallops noted LUNGS: Lungs are clear to auscultation bilaterally, no apparent distress ABDOMEN: soft, nontender, no rebound or guarding GU:no cva tenderness NEURO:Awake/alert, face symmetric, no arm or leg drift is noted Equal 5/5 strength with shoulder abduction, elbow flex/extension, wrist flex/extension in upper extremities and equal hand grips bilaterally Cranial nerves 3/4/5/6/09/11/08/11/12 tested and intact Gait normal without ataxia No past pointing Sensation to light touch intact in all extremities EXTREMITIES: pulses normal, full ROM SKIN: warm, color normal PSYCH: mildly anxious   ED Treatments / Results  Labs (all labs ordered are listed, but only abnormal results are displayed) Labs Reviewed - No data to display  EKG None  Radiology No results found.  Procedures Procedures   Medications Ordered in ED Medications  propranolol (INDERAL) tablet 20 mg (has no administration in time range)     Initial Impression / Assessment and Plan / ED Course  I have reviewed the triage vital signs and the nursing notes.   She reports she is already feeling improved after speaking with people in the waiting room.  She is smiling and in no acute distress.  There is no focal neuro deficits. She reports that propranolol pricing may have dropped, therefore she is asking for refill.  She is also asking for a one-time dose in the ER. There are no signs of acute neurologic emergency, will discharge home  Final Clinical Impressions(s) / ED Diagnoses   Final diagnoses:  Other headache syndrome    ED Discharge Orders         Ordered    propranolol ER (INDERAL LA) 60 MG 24 hr capsule   Daily     12/30/17 0029           Zadie Rhine, MD 12/30/17 0045

## 2017-12-30 NOTE — Discharge Instructions (Signed)

## 2017-12-31 ENCOUNTER — Ambulatory Visit: Payer: Medicaid Other | Admitting: Physician Assistant

## 2018-01-07 ENCOUNTER — Telehealth (HOSPITAL_COMMUNITY): Payer: Self-pay | Admitting: Obstetrics and Gynecology

## 2018-01-07 NOTE — Telephone Encounter (Signed)
Called patient regarding setting up screening mammmogram but unable to leave message.

## 2018-01-08 ENCOUNTER — Ambulatory Visit: Payer: Medicaid Other | Admitting: Physician Assistant

## 2018-01-08 ENCOUNTER — Encounter: Payer: Self-pay | Admitting: Physician Assistant

## 2018-01-08 VITALS — BP 100/70 | HR 69 | Temp 97.7°F | Ht 64.25 in | Wt 188.5 lb

## 2018-01-08 DIAGNOSIS — F319 Bipolar disorder, unspecified: Secondary | ICD-10-CM

## 2018-01-08 DIAGNOSIS — I10 Essential (primary) hypertension: Secondary | ICD-10-CM

## 2018-01-08 DIAGNOSIS — R7989 Other specified abnormal findings of blood chemistry: Secondary | ICD-10-CM

## 2018-01-08 DIAGNOSIS — E785 Hyperlipidemia, unspecified: Secondary | ICD-10-CM

## 2018-01-08 MED ORDER — MONTELUKAST SODIUM 10 MG PO TABS: 10.0000 mg | ORAL_TABLET | Freq: Every day | ORAL | 4 refills | Status: DC

## 2018-01-08 MED ORDER — LOSARTAN POTASSIUM 25 MG PO TABS: 25.0000 mg | ORAL_TABLET | Freq: Every day | ORAL | 4 refills | Status: DC

## 2018-01-08 MED ORDER — PROPRANOLOL HCL ER 60 MG PO CP24: 60.0000 mg | ORAL_CAPSULE | Freq: Every day | ORAL | 4 refills | Status: DC

## 2018-01-08 NOTE — Patient Instructions (Signed)

## 2018-01-08 NOTE — Progress Notes (Signed)
BP 100/70 (BP Location: Left Arm, Patient Position: Sitting, Cuff Size: Normal)   Pulse 69   Temp 97.7 F (36.5 C)   Ht 5' 4.25" (1.632 m)   Wt 188 lb 8 oz (85.5 kg)   LMP 12/18/2017 (Approximate)   SpO2 98%   BMI 32.10 kg/m    Subjective:    Patient ID: Carolyn Daniels, female    DOB: Jul 29, 1970, 47 y.o.   MRN: [id number]  HPI: Carolyn Daniels is a 47 y.o. female presenting on 01/08/2018 for Follow-up   HPI   Pt says she has been to [facility] but "it was crazy" so she needs to go back.    She says the Propranolol has been helping her HA.    She got appt for mammogram in January  Relevant past medical, surgical, family and social history reviewed and updated as indicated. Interim medical history since our last visit reviewed. Allergies and medications reviewed and updated.   Current Outpatient Medications:  .  cetirizine (ZYRTEC) 10 MG tablet, Take 10 mg by mouth daily., Disp: , Rfl:  .  hydrochlorothiazide (MICROZIDE) 12.5 MG capsule, Take 12.5 mg by mouth daily., Disp: , Rfl:  .  losartan (COZAAR) 25 MG tablet, Take 1 tablet (25 mg total) by mouth daily., Disp: 30 tablet, Rfl: 1 .  montelukast (SINGULAIR) 10 MG tablet, Take 1 tablet (10 mg total) by mouth at bedtime., Disp: 30 tablet, Rfl: 1 .  Multiple Vitamin (MULTIVITAMIN WITH MINERALS) TABS tablet, Take 1 tablet by mouth daily., Disp: , Rfl:  .  Probiotic Product (PROBIOTIC DAILY PO), Take 1 tablet by mouth daily., Disp: , Rfl:  .  propranolol ER (INDERAL LA) 60 MG 24 hr capsule, Take 1 capsule (60 mg total) by mouth daily., Disp: 14 capsule, Rfl: 0 .  risperiDONE (RISPERDAL) 1 MG tablet, Take 1 mg by mouth at bedtime., Disp: , Rfl:    Review of Systems  Constitutional: Negative for appetite change, chills, diaphoresis, fatigue, fever and unexpected weight change.  HENT: Positive for congestion. Negative for dental problem, drooling, ear pain, facial swelling, hearing loss, mouth sores, sneezing, sore throat,  trouble swallowing and voice change.   Eyes: Negative for pain, discharge, redness, itching and visual disturbance.  Respiratory: Negative for cough, choking, shortness of breath and wheezing.   Cardiovascular: Negative for chest pain, palpitations and leg swelling.  Gastrointestinal: Negative for abdominal pain, blood in stool, constipation, diarrhea and vomiting.  Endocrine: Negative for cold intolerance, heat intolerance and polydipsia.  Genitourinary: Negative for decreased urine volume, dysuria and hematuria.  Musculoskeletal: Negative for arthralgias, back pain and gait problem.  Skin: Negative for rash.  Allergic/Immunologic: Positive for environmental allergies.  Neurological: Positive for headaches. Negative for seizures, syncope and light-headedness.  Hematological: Negative for adenopathy.  Psychiatric/Behavioral: Negative for agitation, dysphoric mood and suicidal ideas. The patient is not nervous/anxious.     Per HPI unless specifically indicated above     Objective:    BP 100/70 (BP Location: Left Arm, Patient Position: Sitting, Cuff Size: Normal)   Pulse 69   Temp 97.7 F (36.5 C)   Ht 5' 4.25" (1.632 m)   Wt 188 lb 8 oz (85.5 kg)   LMP 12/18/2017 (Approximate)   SpO2 98%   BMI 32.10 kg/m   Wt Readings from Last 3 Encounters:  01/08/18 188 lb 8 oz (85.5 kg)  12/29/17 190 lb (86.2 kg)  12/03/17 191 lb (86.6 kg)    Physical Exam  Constitutional: She is  oriented to person, place, and time. She appears well-developed and well-nourished.  HENT:  Head: Normocephalic and atraumatic.  Neck: Neck supple.  Cardiovascular: Normal rate and regular rhythm.  Pulmonary/Chest: Effort normal and breath sounds normal.  Abdominal: Soft. Bowel sounds are normal. She exhibits no mass. There is no hepatosplenomegaly. There is no tenderness.  Musculoskeletal: She exhibits no edema.  Lymphadenopathy:    She has no cervical adenopathy.  Neurological: She is alert and oriented to  person, place, and time.  Skin: Skin is warm and dry.  Psychiatric: She has a normal mood and affect. Her behavior is normal.  Vitals reviewed.   Results for orders placed or performed during the hospital encounter of 12/20/17  Vitamin D (25 hydroxy)  Result Value Ref Range   Vit D, 25-Hydroxy 25.7 (L) 30.0 - 100.0 ng/mL  Lipid panel  Result Value Ref Range   Cholesterol 211 (H) 0 - 200 mg/dL   Triglycerides 161 <096 mg/dL   HDL 50 >04 mg/dL   Total CHOL/HDL Ratio 4.2 RATIO   VLDL 21 0 - 40 mg/dL   LDL Cholesterol 540 (H) 0 - 99 mg/dL  Comprehensive metabolic panel  Result Value Ref Range   Sodium 135 135 - 145 mmol/L   Potassium 3.6 3.5 - 5.1 mmol/L   Chloride 102 98 - 111 mmol/L   CO2 24 22 - 32 mmol/L   Glucose, Bld 113 (H) 70 - 99 mg/dL   BUN 10 6 - 20 mg/dL   Creatinine, Ser 9.81 0.44 - 1.00 mg/dL   Calcium 9.1 8.9 - 19.1 mg/dL   Total Protein 7.5 6.5 - 8.1 g/dL   Albumin 4.2 3.5 - 5.0 g/dL   AST 19 15 - 41 U/L   ALT 20 0 - 44 U/L   Alkaline Phosphatase 79 38 - 126 U/L   Total Bilirubin 0.7 0.3 - 1.2 mg/dL   GFR calc non Af Amer >60 >60 mL/min   GFR calc Af Amer >60 >60 mL/min   Anion gap 9 5 - 15  CBC  Result Value Ref Range   WBC 9.2 4.0 - 10.5 K/uL   RBC 4.52 3.87 - 5.11 MIL/uL   Hemoglobin 13.7 12.0 - 15.0 g/dL   HCT 47.8 29.5 - 62.1 %   MCV 93.4 80.0 - 100.0 fL   MCH 30.3 26.0 - 34.0 pg   MCHC 32.5 30.0 - 36.0 g/dL   RDW 30.8 65.7 - 84.6 %   Platelets 421 (H) 150 - 400 K/uL   nRBC 0.0 0.0 - 0.2 %  Hemoglobin A1c  Result Value Ref Range   Hgb A1c MFr Bld 5.8 (H) 4.8 - 5.6 %   Mean Plasma Glucose 119.76 mg/dL      Assessment & Plan:    Encounter Diagnoses  Name Primary?  . Essential hypertension Yes  . Hyperlipidemia, unspecified hyperlipidemia type   . Bipolar affective disorder, remission status unspecified (HCC)   . Elevated platelet count     -reviewed labs with pt -reccomend discontinuing the hctz as at last OV but she is still taking  it -pt encouraged to follow Low fat diet -pt to follow up 3 months.  RTO sooner prn

## 2018-01-11 ENCOUNTER — Telehealth (HOSPITAL_COMMUNITY): Payer: Self-pay | Admitting: *Deleted

## 2018-01-11 NOTE — Telephone Encounter (Signed)
Telephoned patient, left a message to return a call to [facility].

## 2018-01-22 ENCOUNTER — Ambulatory Visit: Payer: Medicaid Other | Admitting: Physician Assistant

## 2018-01-22 ENCOUNTER — Encounter: Payer: Self-pay | Admitting: Physician Assistant

## 2018-01-22 VITALS — BP 90/66 | HR 68 | Temp 97.9°F | Ht 64.25 in | Wt 192.5 lb

## 2018-01-22 DIAGNOSIS — G43C0 Periodic headache syndromes in child or adult, not intractable: Secondary | ICD-10-CM

## 2018-01-22 DIAGNOSIS — F319 Bipolar disorder, unspecified: Secondary | ICD-10-CM

## 2018-01-22 NOTE — Progress Notes (Signed)
BP 90/66 (BP Location: Left Arm, Patient Position: Sitting, Cuff Size: Normal)   Pulse 68   Temp 97.9 F (36.6 C)   Ht 5' 4.25" (1.632 m)   Wt 192 lb 8 oz (87.3 kg)   SpO2 96%   BMI 32.79 kg/m    Subjective:    Patient ID: Carolyn Daniels, female    DOB: 08/31/1970, 47 y.o.   MRN: [id number]  HPI: Carolyn Baltimorelizabeth M Mcinerny is a 47 y.o. female presenting on 01/22/2018 for Headache   HPI   Pt is here today to inquire if she needs to be referred to neurologist for her headaches to see if she needs any further testing.   Pt was in office 2 weeks ago and was doing quite well on prophylactic propranolol.  She continues to do well on this although she says it sometimes makes her sleepy.  She says she was having vision problems (blurry)  but that was fixed by getting new glasses recently.    Relevant past medical, surgical, family and social history reviewed and updated as indicated. Interim medical history since our last visit reviewed. Allergies and medications reviewed and updated.   Current Outpatient Medications:  .  cetirizine (ZYRTEC) 10 MG tablet, Take 10 mg by mouth daily., Disp: , Rfl:  .  hydrochlorothiazide (MICROZIDE) 12.5 MG capsule, Take 12.5 mg by mouth daily., Disp: , Rfl:  .  losartan (COZAAR) 25 MG tablet, Take 1 tablet (25 mg total) by mouth daily., Disp: 30 tablet, Rfl: 4 .  montelukast (SINGULAIR) 10 MG tablet, Take 1 tablet (10 mg total) by mouth at bedtime., Disp: 30 tablet, Rfl: 4 .  Multiple Vitamin (MULTIVITAMIN WITH MINERALS) TABS tablet, Take 1 tablet by mouth daily., Disp: , Rfl:  .  propranolol ER (INDERAL LA) 60 MG 24 hr capsule, Take 1 capsule (60 mg total) by mouth daily., Disp: 30 capsule, Rfl: 4 .  risperiDONE (RISPERDAL) 1 MG tablet, Take 1 mg by mouth at bedtime., Disp: , Rfl:  .  Probiotic Product (PROBIOTIC DAILY PO), Take 1 tablet by mouth daily., Disp: , Rfl:   Review of Systems  Constitutional: Negative for appetite change, chills, diaphoresis,  fatigue, fever and unexpected weight change.  HENT: Negative for congestion, dental problem, drooling, ear pain, facial swelling, hearing loss, mouth sores, sneezing, sore throat, trouble swallowing and voice change.   Eyes: Negative for pain, discharge, redness, itching and visual disturbance.  Respiratory: Negative for cough, choking, shortness of breath and wheezing.   Cardiovascular: Negative for chest pain, palpitations and leg swelling.  Gastrointestinal: Negative for abdominal pain, blood in stool, constipation, diarrhea and vomiting.  Endocrine: Negative for cold intolerance, heat intolerance and polydipsia.  Genitourinary: Negative for decreased urine volume, dysuria and hematuria.  Musculoskeletal: Negative for arthralgias, back pain and gait problem.  Skin: Negative for rash.  Allergic/Immunologic: Negative for environmental allergies.  Neurological: Positive for headaches. Negative for seizures, syncope and light-headedness.  Hematological: Negative for adenopathy.  Psychiatric/Behavioral: Negative for agitation, dysphoric mood and suicidal ideas. The patient is not nervous/anxious.     Per HPI unless specifically indicated above     Objective:    BP 90/66 (BP Location: Left Arm, Patient Position: Sitting, Cuff Size: Normal)   Pulse 68   Temp 97.9 F (36.6 C)   Ht 5' 4.25" (1.632 m)   Wt 192 lb 8 oz (87.3 kg)   SpO2 96%   BMI 32.79 kg/m   Wt Readings from Last 3 Encounters:  01/22/18  192 lb 8 oz (87.3 kg)  01/08/18 188 lb 8 oz (85.5 kg)  12/29/17 190 lb (86.2 kg)    Physical Exam  Constitutional: She is oriented to person, place, and time. She appears well-developed and well-nourished.  Non-toxic appearance.  HENT:  Head: Normocephalic and atraumatic.  Pulmonary/Chest: Effort normal. No respiratory distress.  Neurological: She is alert and oriented to person, place, and time.  Skin: Skin is warm and dry.  Psychiatric: Her speech is normal.  Pt a bit manic   Nursing note and vitals reviewed.       Assessment & Plan:   Encounter Diagnoses  Name Primary?  . Periodic headache syndrome, not intractable   . Bipolar affective disorder, remission status unspecified (HCC) Yes     Reassured pt No need for referral  Pt to follow up in february as scheduled.  RTO sooner prn

## 2018-01-30 ENCOUNTER — Encounter (HOSPITAL_COMMUNITY): Payer: Self-pay | Admitting: Emergency Medicine

## 2018-01-30 ENCOUNTER — Other Ambulatory Visit: Payer: Self-pay

## 2018-01-30 ENCOUNTER — Emergency Department (HOSPITAL_COMMUNITY)
Admission: EM | Admit: 2018-01-30 | Discharge: 2018-01-30 | Disposition: A | Discharge status: Home / Self Care | Payer: Medicaid Other | Attending: Emergency Medicine | Admitting: Emergency Medicine

## 2018-01-30 DIAGNOSIS — F419 Anxiety disorder, unspecified: Secondary | ICD-10-CM

## 2018-01-30 DIAGNOSIS — I10 Essential (primary) hypertension: Secondary | ICD-10-CM | POA: Insufficient documentation

## 2018-01-30 DIAGNOSIS — F310 Bipolar disorder, current episode hypomanic: Secondary | ICD-10-CM | POA: Insufficient documentation

## 2018-01-30 DIAGNOSIS — J45909 Unspecified asthma, uncomplicated: Secondary | ICD-10-CM | POA: Insufficient documentation

## 2018-01-30 DIAGNOSIS — Z008 Encounter for other general examination: Secondary | ICD-10-CM | POA: Insufficient documentation

## 2018-01-30 DIAGNOSIS — Z79899 Other long term (current) drug therapy: Secondary | ICD-10-CM | POA: Insufficient documentation

## 2018-01-30 LAB — PREGNANCY, URINE: PREG TEST UR: NEGATIVE

## 2018-01-30 LAB — RAPID URINE DRUG SCREEN, HOSP PERFORMED
Amphetamines: NOT DETECTED
BARBITURATES: NOT DETECTED
BENZODIAZEPINES: NOT DETECTED
Cocaine: NOT DETECTED
Opiates: NOT DETECTED
Tetrahydrocannabinol: NOT DETECTED

## 2018-01-30 LAB — URINALYSIS, ROUTINE W REFLEX MICROSCOPIC
Bilirubin Urine: NEGATIVE
Glucose, UA: NEGATIVE mg/dL
HGB URINE DIPSTICK: NEGATIVE
KETONES UR: NEGATIVE mg/dL
Leukocytes, UA: NEGATIVE
NITRITE: NEGATIVE
PROTEIN: NEGATIVE mg/dL
SPECIFIC GRAVITY, URINE: 1.026 (ref 1.005–1.030)
pH: 6 (ref 5.0–8.0)

## 2018-01-30 NOTE — ED Notes (Signed)
Pt just finished her tts consult.
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
## 2018-01-30 NOTE — Discharge Instructions (Addendum)
[facility] or another counselor from the attached list.  Return to the ED if you develop suicidal thoughts, homicidal thoughts or any other concerns.

## 2018-01-30 NOTE — ED Provider Notes (Signed)
[facility] EMERGENCY DEPARTMENT Provider Note   CSN: [id number] Arrival date & time: 01/30/18  0244     History   Chief Complaint Chief Complaint  Patient presents with  . Anxiety    HPI Carolyn Daniels is a 46 y.o. female.  Level 5 caveat for psychiatric illness.  Patient drove herself to the ED after becoming upset and tearful after an argument with her father over the phone. States her husband told her to "get out of here" due to her emotional distress. Denies any physical altercation.  States she is "grieving the loss of my grandmother" which has been several years as well as grieving the loss of (her now adult) children that were taken out of her custody in the past.  She states she "never had a chance to grieve" and "just want somebody to listen".  She is feeling better after crying on the way here.  She has pressured and anxious speech and rapid thoughts.  She has a history of bipolar disorder. states she has not had Lexapro for several months due to cost.  She denies any alcohol or drug use tonight or on a regular basis.  She denies any suicidal homicidal thoughts but admits to feeling depressed and worried over her home situation.  She states there is financial concerns at home and they are struggling to pay for medications and food and utilities.  Patient lives with her husband who is disabled and is his primary caretaker.  She denies hearing any voices.  She denies any plan for suicide or homicide.  She denies any physical complaints though she has been recently dealing with migraine headaches.  No headache at this time.  No chest pain, shortness of breath, abdominal pain, nausea, vomiting, fever.  The history is provided by the patient.  Anxiety  Associated symptoms include headaches. Pertinent negatives include no chest pain, no abdominal pain and no shortness of breath.    Past Medical History:  Diagnosis Date  . Allergy   . Anemia   . Arthritis   . Asthma   .  Bipolar 1 disorder (HCC)   . Depression   . GERD (gastroesophageal reflux disease)   . Heel spur   . Hypertension   . Migraines   . Migraines   . Pre-diabetes   . Scoliosis     Patient Active Problem List   Diagnosis Date Noted  . Essential hypertension 12/03/2017  . Bipolar affective disorder (HCC) 12/03/2017  . Adjustment disorder with emotional disturbance 09/25/2017    Past Surgical History:  Procedure Laterality Date  . CESAREAN SECTION    . RHINOPLASTY  01/2017  . WISDOM TOOTH EXTRACTION Bilateral      OB History   None      Home Medications    Prior to Admission medications   Medication Sig Start Date End Date Taking? Authorizing Provider  cetirizine (ZYRTEC) 10 MG tablet Take 10 mg by mouth daily.   Yes [provider]  hydrochlorothiazide (MICROZIDE) 12.5 MG capsule Take 12.5 mg by mouth daily.   Yes [provider]  losartan (COZAAR) 25 MG tablet Take 1 tablet (25 mg total) by mouth daily. 01/08/18  Yes Jacquelin Hawking, PA-C  montelukast (SINGULAIR) 10 MG tablet Take 1 tablet (10 mg total) by mouth at bedtime. 01/08/18  Yes Jacquelin Hawking, PA-C  Multiple Vitamin (MULTIVITAMIN WITH MINERALS) TABS tablet Take 1 tablet by mouth daily.   Yes [provider]  Probiotic Product (PROBIOTIC DAILY PO) Take  1 tablet by mouth daily.   Yes [provider]  propranolol ER (INDERAL LA) 60 MG 24 hr capsule Take 1 capsule (60 mg total) by mouth daily. 01/08/18  Yes Jacquelin Hawking, PA-C  risperiDONE (RISPERDAL) 1 MG tablet Take 1 mg by mouth at bedtime.   Yes [provider]    Family History Family History  Problem Relation Age of Onset  . Stroke Maternal Grandfather   . Hypertension Maternal Grandfather     Social History Social History   Tobacco Use  . Smoking status: Never Smoker  . Smokeless tobacco: Never Used  Substance Use Topics  . Alcohol use: Not Currently    Comment: occasionally  . Drug use: Not  Currently     Allergies   Augmentin [amoxicillin-pot clavulanate]; Depakote er [divalproex sodium er]; Lamictal [lamotrigine]; Sudafed [pseudoephedrine hcl]; and Topamax [topiramate]   Review of Systems Review of Systems  Constitutional: Negative for activity change, appetite change and fever.  HENT: Negative for congestion and rhinorrhea.   Respiratory: Negative for cough, chest tightness and shortness of breath.   Cardiovascular: Negative for chest pain.  Gastrointestinal: Negative for abdominal pain, nausea and vomiting.  Genitourinary: Negative for dysuria, hematuria, vaginal bleeding and vaginal discharge.  Skin: Negative for rash.  Neurological: Positive for headaches. Negative for dizziness.  Psychiatric/Behavioral: Positive for agitation, decreased concentration and sleep disturbance. Negative for suicidal ideas. The patient is nervous/anxious and is hyperactive.    all other systems are negative except as noted in the HPI and PMH.     Physical Exam Updated Vital Signs BP 139/71 (BP Location: Left Arm)   Pulse 73   Temp 97.9 F (36.6 C) (Oral)   Resp 20   Ht 5\' 4"  (1.626 m)   Wt 87.3 kg   LMP 12/18/2017 (Approximate)   SpO2 100%   BMI 33.04 kg/m   Physical Exam  Constitutional: She is oriented to person, place, and time. She appears well-developed and well-nourished. No distress.  Rapid and pressured speech  HENT:  Head: Normocephalic and atraumatic.  Mouth/Throat: Oropharynx is clear and moist. No oropharyngeal exudate.  Eyes: Pupils are equal, round, and reactive to light. Conjunctivae and EOM are normal.  Neck: Normal range of motion. Neck supple.  No meningismus.  Cardiovascular: Normal rate, regular rhythm, normal heart sounds and intact distal pulses.  No murmur heard. Pulmonary/Chest: Effort normal and breath sounds normal. No respiratory distress. She exhibits no tenderness.  Abdominal: Soft. There is no tenderness. There is no rebound and no guarding.   Musculoskeletal: Normal range of motion. She exhibits no edema or tenderness.  Neurological: She is alert and oriented to person, place, and time. No cranial nerve deficit. She exhibits normal muscle tone. Coordination normal.  No ataxia on finger to nose bilaterally. No pronator drift. 5/5 strength throughout. CN 2-12 intact.Equal grip strength. Sensation intact.   Skin: Skin is warm. Capillary refill takes less than 2 seconds.  Psychiatric:  Rapid and pressured speech, flight of ideas  Inappropriate laughter at times.   Nursing note and vitals reviewed.    ED Treatments / Results  Labs (all labs ordered are listed, but only abnormal results are displayed) Labs Reviewed  URINALYSIS, ROUTINE W REFLEX MICROSCOPIC - Abnormal; Notable for the following components:      Result Value   APPearance HAZY (*)    All other components within normal limits  PREGNANCY, URINE  RAPID URINE DRUG SCREEN, HOSP PERFORMED    EKG None  Radiology No results  found.  Procedures Procedures (including critical care time)  Medications Ordered in ED Medications - No data to display   Initial Impression / Assessment and Plan / ED Course  I have reviewed the triage vital signs and the nursing notes.  Pertinent labs & imaging results that were available during my care of the patient were reviewed by me and considered in my medical decision making (see chart for details).    Patient with history of bipolar disorder here with emotional distress after argument at home. Appears slightly manic.  Admits to depression but denies suicidal or homicidal thoughts.  Inappropriate laughter at times.  Denies alcohol or drug use.  UDS negative.  Patient with tangential and pressured speech.  She is medically clear for psychiatric evaluation.  TTS consult complete.  Patient does not meet inpatient criteria.  She denies any suicidal or homicidal thoughts.  She states she has a safe place to go. Advised to  follow-up with her PCP as well as her [employer] counselor tomorrow. Return precautions discussed.   Final Clinical Impressions(s) / ED Diagnoses   Final diagnoses:  Anxiety    ED Discharge Orders    None       Ashtyn Meland, Jeannett SeniorStephen, MD 01/30/18 0430

## 2018-01-30 NOTE — ED Triage Notes (Signed)
Pt is tearful and crying due to recent loss of grandmother.

## 2018-02-05 ENCOUNTER — Encounter: Payer: Self-pay | Admitting: Physician Assistant

## 2018-02-19 ENCOUNTER — Other Ambulatory Visit (HOSPITAL_COMMUNITY): Payer: Self-pay | Admitting: *Deleted

## 2018-02-19 DIAGNOSIS — Z1231 Encounter for screening mammogram for malignant neoplasm of breast: Secondary | ICD-10-CM

## 2018-02-22 ENCOUNTER — Ambulatory Visit: Payer: Self-pay | Admitting: Family Medicine

## 2018-03-07 ENCOUNTER — Other Ambulatory Visit: Payer: Self-pay | Admitting: Physician Assistant

## 2018-03-07 MED ORDER — HYDROCHLOROTHIAZIDE 12.5 MG PO CAPS: 12.5000 mg | ORAL_CAPSULE | Freq: Every day | ORAL | 3 refills | Status: DC

## 2018-03-21 ENCOUNTER — Ambulatory Visit (HOSPITAL_COMMUNITY): Payer: Medicaid Other

## 2018-03-23 ENCOUNTER — Other Ambulatory Visit: Payer: Self-pay

## 2018-03-23 ENCOUNTER — Encounter (HOSPITAL_COMMUNITY): Payer: Self-pay | Admitting: Emergency Medicine

## 2018-03-23 ENCOUNTER — Emergency Department (HOSPITAL_COMMUNITY)
Admission: EM | Admit: 2018-03-23 | Discharge: 2018-03-23 | Disposition: A | Discharge status: Home / Self Care | Payer: Medicaid Other | Attending: Emergency Medicine | Admitting: Emergency Medicine

## 2018-03-23 DIAGNOSIS — G43909 Migraine, unspecified, not intractable, without status migrainosus: Secondary | ICD-10-CM | POA: Insufficient documentation

## 2018-03-23 DIAGNOSIS — Z79899 Other long term (current) drug therapy: Secondary | ICD-10-CM | POA: Insufficient documentation

## 2018-03-23 DIAGNOSIS — J45909 Unspecified asthma, uncomplicated: Secondary | ICD-10-CM | POA: Insufficient documentation

## 2018-03-23 DIAGNOSIS — R7303 Prediabetes: Secondary | ICD-10-CM | POA: Insufficient documentation

## 2018-03-23 DIAGNOSIS — I10 Essential (primary) hypertension: Secondary | ICD-10-CM | POA: Insufficient documentation

## 2018-03-23 MED ORDER — SODIUM CHLORIDE 0.9 % IV BOLUS
1000.0000 mL | Freq: Once | INTRAVENOUS | Status: AC
  Administered 2018-03-23: 1000 mL via INTRAVENOUS

## 2018-03-23 MED ORDER — MELOXICAM 7.5 MG PO TABS: 7.5000 mg | ORAL_TABLET | Freq: Two times a day (BID) | ORAL | 0 refills | Status: AC | PRN

## 2018-03-23 MED ORDER — KETOROLAC TROMETHAMINE 30 MG/ML IJ SOLN
30.0000 mg | Freq: Once | INTRAMUSCULAR | Status: AC
  Administered 2018-03-23: 30 mg via INTRAVENOUS
  Filled 2018-03-23: qty 1

## 2018-03-23 NOTE — ED Notes (Signed)
Pt states she is feeling much better. Pt states "when I first got here I felt like I was out of it but now I am able to communicate"

## 2018-03-23 NOTE — ED Triage Notes (Signed)
Patient c/o migraine headache that started this morning at 8am. 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 Per patient taking propranolol to help manage blood pressure and migraines. Patient states some lightheadedness and nausea.

## 2018-03-23 NOTE — ED Provider Notes (Signed)
[facility] EMERGENCY DEPARTMENT Provider Note   CSN: [id number] Arrival date & time: 03/23/18  1835     History   Chief Complaint Chief Complaint  Patient presents with  . Migraine    HPI Carolyn Daniels is a 48 y.o. female.  HPI  The patient is a 48 year old female, she has a history of multiple medical problems including bipolar disorder, history of migraines for which she is treated with propanolol and a history of hypertension for which she takes hydrochlorothiazide and an angiotensin receptor blocker as well.  She presents today with a complaint of a headache that started this morning at 8:00.  She states that the headache is been rather persistent throughout the day.  She has some sensitivity to light.  This is similar to prior headaches.  Usually the propanolol helps.  She has not had much relief today.  She continues to talk about a small mass that she had on her pineal gland which was documented over 10 years ago.  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  This was approximately 8 x 6 x 8 mm in dimension.  The patient denies any other symptoms.  She denies any numbness, weakness, slurred speech, fever, stiff neck.  Past Medical History:  Diagnosis Date  . Allergy   . Anemia   . Arthritis   . Asthma   . Bipolar 1 disorder (HCC)   . Depression   . GERD (gastroesophageal reflux disease)   . Heel spur   . Hypertension   . Migraines   . Migraines   . Pre-diabetes   . Scoliosis     Patient Active Problem List   Diagnosis Date Noted  . Essential hypertension 12/03/2017  . Bipolar affective disorder (HCC) 12/03/2017  . Adjustment disorder with emotional disturbance 09/25/2017    Past Surgical History:  Procedure Laterality Date  . CESAREAN SECTION    . RHINOPLASTY  01/2017  . WISDOM TOOTH EXTRACTION Bilateral       OB History   No obstetric history on file.      Home Medications    Prior to Admission medications   Medication Sig Start Date End Date Taking? Authorizing Provider  cetirizine (ZYRTEC) 10 MG tablet Take 10 mg by mouth daily.    [provider]  hydrochlorothiazide (MICROZIDE) 12.5 MG capsule Take 1 capsule (12.5 mg total) by mouth daily. 03/07/18   Jacquelin Hawking, PA-C  losartan (COZAAR) 25 MG tablet Take 1 tablet (25 mg total) by mouth daily. 01/08/18   Jacquelin Hawking, PA-C  meloxicam (MOBIC) 7.5 MG tablet Take 1 tablet (7.5 mg total) by mouth 2 (two) times daily as needed for up to 15 days for pain. 03/23/18 04/07/18  Eber Hong, MD  montelukast (SINGULAIR) 10 MG tablet Take 1 tablet (10 mg total) by mouth at bedtime. 01/08/18   Jacquelin Hawking, PA-C  Probiotic Product (PROBIOTIC DAILY PO) Take 1 tablet by mouth daily.    [provider]  propranolol ER (INDERAL LA) 60 MG 24 hr capsule Take 1 capsule (60 mg total) by mouth daily. 01/08/18   Jacquelin Hawking, PA-C  risperiDONE (RISPERDAL) 1 MG tablet Take 1 mg by mouth at bedtime.    [provider]    Family History Family History  Problem Relation Age of Onset  . Stroke Maternal Grandfather   .  Hypertension Maternal Grandfather     Social History Social History   Tobacco Use  . Smoking status: Never Smoker  . Smokeless tobacco: Never Used  Substance Use Topics  . Alcohol use: Never    Frequency: Never  . Drug use: Not Currently     Allergies   Augmentin [amoxicillin-pot clavulanate]; Depakote er [divalproex sodium er]; Lamictal [lamotrigine]; Sudafed [pseudoephedrine hcl]; Sulfamethoxazole; and Topamax [topiramate]   Review of Systems Review of Systems  All other systems reviewed and are negative.    Physical Exam Updated Vital Signs BP 122/81 (BP Location: Left Arm)   Pulse 70   Temp 98.1 F (36.7 C) (Oral)   Resp 14   Ht 1.651 m (5\' 5" )   Wt 81.6 kg   LMP 12/18/2017  (Approximate)   SpO2 100%   BMI 29.95 kg/m   Physical Exam Vitals signs and nursing note reviewed.  Constitutional:      General: She is not in acute distress.    Appearance: She is well-developed.  HENT:     Head: Normocephalic and atraumatic.     Mouth/Throat:     Pharynx: No oropharyngeal exudate.  Eyes:     General: No scleral icterus.       Right eye: No discharge.        Left eye: No discharge.     Conjunctiva/sclera: Conjunctivae normal.     Pupils: Pupils are equal, round, and reactive to light.  Neck:     Musculoskeletal: Normal range of motion and neck supple.     Thyroid: No thyromegaly.     Vascular: No JVD.  Cardiovascular:     Rate and Rhythm: Normal rate and regular rhythm.     Heart sounds: Normal heart sounds. No murmur. No friction rub. No gallop.   Pulmonary:     Effort: Pulmonary effort is normal. No respiratory distress.     Breath sounds: Normal breath sounds. No wheezing or rales.  Abdominal:     General: Bowel sounds are normal. There is no distension.     Palpations: Abdomen is soft. There is no mass.     Tenderness: There is no abdominal tenderness.  Musculoskeletal: Normal range of motion.        General: No tenderness.  Lymphadenopathy:     Cervical: No cervical adenopathy.  Skin:    General: Skin is warm and dry.     Findings: No erythema or rash.  Neurological:     Mental Status: She is alert.     Coordination: Coordination normal.     Comments: Speech is clear, cranial nerves III through XII are intact, memory is intact, strength is normal in all 4 extremities including grips, sensation is intact to light touch and pinprick in all 4 extremities. Coordination as tested by finger-nose-finger is normal, no limb ataxia. Normal gait, normal reflexes at the patellar tendons bilaterally  Psychiatric:        Behavior: Behavior normal.      ED Treatments / Results  Labs (all labs ordered are listed, but only abnormal results are  displayed) Labs Reviewed - No data to display  EKG None  Radiology No results found.  Procedures Procedures (including critical care time)  Medications Ordered in ED Medications  sodium chloride 0.9 % bolus 1,000 mL (1,000 mLs Intravenous New Bag/Given 03/23/18 1952)  ketorolac (TORADOL) 30 MG/ML injection 30 mg (30 mg Intravenous Given 03/23/18 1956)     Initial Impression / Assessment and Plan / ED Course  I have reviewed the triage vital signs and the nursing notes.  Pertinent labs & imaging results that were available during my care of the patient were reviewed by me and considered in my medical decision making (see chart for details).     The patient's exam is unremarkable, thankfully she appears well without focal neurologic deficits.  At this time I would recommend some injectable anti-inflammatories, she does not have an allergy to that, we will proceed with Toradol and IV fluids.  Patient agreeable  After IV fluids and Toradol the patient is almost completely better and ready for discharge.  She was not given any sedating medications and at this time is a normal mental status.  Final Clinical Impressions(s) / ED Diagnoses   Final diagnoses:  Migraine without status migrainosus, not intractable, unspecified migraine type    ED Discharge Orders         Ordered    meloxicam (MOBIC) 7.5 MG tablet  2 times daily PRN     03/23/18 2124           Eber HongMiller, Emeril Stille, MD 03/23/18 2125

## 2018-03-23 NOTE — ED Notes (Signed)
Pt reports a lengthy history of medical history  Of medical problems dating from 2005 of sinus problems,  Hx of headaches, loss of insurance, [facility] and unable to see neurologist "unless I have seizures"  Reports she cannot get medicaid due to lein on her husband's property in [place] and that she cannot sell it  She has had treratment in [place] where she went to be with her children who are in college

## 2018-03-23 NOTE — ED Notes (Signed)
Pt ambulates heel to toe, reports only med taken for HA is tylenol  Speaks in complete sentences

## 2018-03-23 NOTE — ED Notes (Signed)
Pt reports she feels much better  Felt like she was not thinking clearly when she arrived but much better now

## 2018-03-23 NOTE — ED Notes (Signed)
Dr M in to assess  

## 2018-03-23 NOTE — Discharge Instructions (Signed)
I am happy that your headache has improved.  Please read the above instructions about headaches and take Mobic, twice a day as needed for headache.  Please drink plenty of fluids.  See your doctor in the next 48 hours if no improvement but return to the emergency department for severe or worsening symptoms.  Thank you for letting us take care of you today!  Please obtain all of your results from medical records or have your doctors office obtain the results - share them with your doctor - you should be seen at your doctors office in the next 2 days. Call today to arrange your follow up. Take the medications as prescribed. Please review all of the medicines and only take them if you do not have an allergy to them. Please be aware that if you are taking birth control pills, taking other prescriptions, ESPECIALLY ANTIBIOTICS may make the birth control ineffective - if this is the case, either do not engage in sexual activity or use alternative methods of birth control such as condoms until you have finished the medicine and your family doctor says it is OK to restart them. If you are on a blood thinner such as COUMADIN, be aware that any other medicine that you take may cause the coumadin to either work too much, or not enough - you should have your coumadin level rechecked in next 7 days if this is the case.  ?  It is also a possibility that you have an allergic reaction to any of the medicines that you have been prescribed - Everybody reacts differently to medications and while MOST people have no trouble with most medicines, you may have a reaction such as nausea, vomiting, rash, swelling, shortness of breath. If this is the case, please stop taking the medicine immediately and contact your physician.   If you were given a medication in the ED such as percocet, vicodin, or morphine, be aware that these medicines are sedating and may change your ability to take care of yourself adequately for several hours  after being given this medicines - you should not drive or take care of small children if you were given this medicine in the Emergency Department or if you have been prescribed these types of medicines. ?   You should return to the ER IMMEDIATELY if you develop severe or worsening symptoms.
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
## 2018-04-02 ENCOUNTER — Other Ambulatory Visit (HOSPITAL_COMMUNITY)
Admission: RE | Admit: 2018-04-02 | Discharge: 2018-04-02 | Disposition: A | Discharge status: Home / Self Care | Payer: Medicaid Other | Source: Ambulatory Visit | Attending: Physician Assistant | Admitting: Physician Assistant

## 2018-04-02 DIAGNOSIS — E785 Hyperlipidemia, unspecified: Secondary | ICD-10-CM | POA: Insufficient documentation

## 2018-04-02 LAB — COMPREHENSIVE METABOLIC PANEL
ALT: 14 U/L (ref 0–44)
AST: 12 U/L — ABNORMAL LOW (ref 15–41)
Albumin: 4.1 g/dL (ref 3.5–5.0)
Alkaline Phosphatase: 72 U/L (ref 38–126)
Anion gap: 6 (ref 5–15)
BUN: 16 mg/dL (ref 6–20)
CO2: 26 mmol/L (ref 22–32)
Calcium: 9.4 mg/dL (ref 8.9–10.3)
Chloride: 105 mmol/L (ref 98–111)
Creatinine, Ser: 0.73 mg/dL (ref 0.44–1.00)
GFR calc Af Amer: >60 mL/min (ref >60)
Glucose, Bld: 102 mg/dL — ABNORMAL HIGH (ref 70–99)
Potassium: 4.2 mmol/L (ref 3.5–5.1)
Sodium: 137 mmol/L (ref 135–145)
Total Bilirubin: 0.4 mg/dL (ref 0.3–1.2)
Total Protein: 7.4 g/dL (ref 6.5–8.1)

## 2018-04-02 LAB — LIPID PANEL
CHOLESTEROL: 212 mg/dL — AB (ref 0–200)
HDL: 51 mg/dL (ref >40)
LDL Cholesterol: 139 mg/dL — ABNORMAL HIGH (ref 0–99)
Total CHOL/HDL Ratio: 4.2 RATIO
Triglycerides: 111 mg/dL (ref <150)
VLDL: 22 mg/dL (ref 0–40)

## 2018-04-10 ENCOUNTER — Encounter: Payer: Self-pay | Admitting: Physician Assistant

## 2018-04-10 ENCOUNTER — Ambulatory Visit: Payer: Medicaid Other | Admitting: Physician Assistant

## 2018-04-10 VITALS — BP 100/66 | HR 78 | Temp 97.7°F | Ht 64.25 in | Wt 193.0 lb

## 2018-04-10 DIAGNOSIS — F319 Bipolar disorder, unspecified: Secondary | ICD-10-CM

## 2018-04-10 DIAGNOSIS — E785 Hyperlipidemia, unspecified: Secondary | ICD-10-CM

## 2018-04-10 DIAGNOSIS — R7989 Other specified abnormal findings of blood chemistry: Secondary | ICD-10-CM

## 2018-04-10 DIAGNOSIS — I10 Essential (primary) hypertension: Secondary | ICD-10-CM

## 2018-04-10 MED ORDER — MELOXICAM 7.5 MG PO TABS: 7.5000 mg | ORAL_TABLET | Freq: Two times a day (BID) | ORAL | 0 refills | Status: DC | PRN

## 2018-04-10 MED ORDER — LOSARTAN POTASSIUM 25 MG PO TABS: 25.0000 mg | ORAL_TABLET | Freq: Every day | ORAL | 1 refills | Status: DC

## 2018-04-10 MED ORDER — ATORVASTATIN CALCIUM 20 MG PO TABS: 20.0000 mg | ORAL_TABLET | Freq: Every day | ORAL | 1 refills | Status: DC

## 2018-04-10 MED ORDER — HYDROCHLOROTHIAZIDE 12.5 MG PO CAPS: 12.5000 mg | ORAL_CAPSULE | Freq: Every day | ORAL | 1 refills | Status: DC

## 2018-04-10 NOTE — Progress Notes (Signed)
BP 100/66 (BP Location: Left Arm, Patient Position: Sitting, Cuff Size: Normal)   Pulse 78   Temp 97.7 F (36.5 C)   Ht 5' 4.25" (1.632 m)   Wt 193 lb (87.5 kg)   LMP 12/18/2017 (Approximate)   SpO2 97%   BMI 32.87 kg/m    Subjective:    Patient ID: Carolyn Daniels, female    DOB: 03/22/1970, 48 y.o.   MRN: [id number]  HPI: Carolyn Daniels is a 48 y.o. female presenting on 04/10/2018 for Follow-up   HPI   Mammogram got reschedudled  To march  [facility]  Pt is feeling well today and has no complaints.    Relevant past medical, surgical, family and social history reviewed and updated as indicated. Interim medical history since our last visit reviewed. Allergies and medications reviewed and updated.    Current Outpatient Medications:  .  cetirizine (ZYRTEC) 10 MG tablet, Take 10 mg by mouth daily., Disp: , Rfl:  .  hydrochlorothiazide (MICROZIDE) 12.5 MG capsule, Take 1 capsule (12.5 mg total) by mouth daily., Disp: 30 capsule, Rfl: 3 .  losartan (COZAAR) 25 MG tablet, Take 1 tablet (25 mg total) by mouth daily., Disp: 30 tablet, Rfl: 4 .  meloxicam (MOBIC) 7.5 MG tablet, Take 7.5 mg by mouth 2 (two) times daily as needed for pain., Disp: , Rfl:  .  montelukast (SINGULAIR) 10 MG tablet, Take 1 tablet (10 mg total) by mouth at bedtime. (Patient taking differently: Take 10 mg by mouth daily. ), Disp: 30 tablet, Rfl: 4 .  Probiotic Product (PROBIOTIC DAILY PO), Take 1 tablet by mouth daily., Disp: , Rfl:  .  propranolol ER (INDERAL LA) 60 MG 24 hr capsule, Take 1 capsule (60 mg total) by mouth daily., Disp: 30 capsule, Rfl: 4 .  risperiDONE (RISPERDAL) 1 MG tablet, Take 1 mg by mouth at bedtime., Disp: , Rfl:     Review of Systems  Constitutional: Positive for fatigue. Negative for appetite change, chills, diaphoresis, fever and unexpected weight change.  HENT: Negative for congestion, dental problem, drooling, ear pain, facial swelling,  hearing loss, mouth sores, sneezing, sore throat, trouble swallowing and voice change.   Eyes: Negative for pain, discharge, redness, itching and visual disturbance.  Respiratory: Negative for cough, choking, shortness of breath and wheezing.   Cardiovascular: Negative for chest pain, palpitations and leg swelling.  Gastrointestinal: Negative for abdominal pain, blood in stool, constipation, diarrhea and vomiting.  Endocrine: Negative for cold intolerance, heat intolerance and polydipsia.  Genitourinary: Negative for decreased urine volume, dysuria and hematuria.  Musculoskeletal: Negative for arthralgias, back pain and gait problem.  Skin: Negative for rash.  Allergic/Immunologic: Positive for environmental allergies.  Neurological: Positive for headaches. Negative for seizures, syncope and light-headedness.  Hematological: Negative for adenopathy.  Psychiatric/Behavioral: Negative for agitation, dysphoric mood and suicidal ideas. The patient is not nervous/anxious.     Per HPI unless specifically indicated above     Objective:    BP 100/66 (BP Location: Left Arm, Patient Position: Sitting, Cuff Size: Normal)   Pulse 78   Temp 97.7 F (36.5 C)   Ht 5' 4.25" (1.632 m)   Wt 193 lb (87.5 kg)   LMP 12/18/2017 (Approximate)   SpO2 97%   BMI 32.87 kg/m   Wt Readings from Last 3 Encounters:  04/10/18 193 lb (87.5 kg)  03/23/18 180 lb (81.6 kg)  01/30/18 192 lb 7.4 oz (87.3 kg)  Physical Exam Vitals signs reviewed.  Constitutional:      Appearance: She is well-developed.  HENT:     Head: Normocephalic and atraumatic.  Neck:     Musculoskeletal: Neck supple.  Cardiovascular:     Rate and Rhythm: Normal rate and regular rhythm.  Pulmonary:     Effort: Pulmonary effort is normal.     Breath sounds: Normal breath sounds.  Abdominal:     General: Bowel sounds are normal.     Palpations: Abdomen is soft. There is no mass.     Tenderness: There is no abdominal tenderness.   Lymphadenopathy:     Cervical: No cervical adenopathy.  Skin:    General: Skin is warm and dry.  Neurological:     Mental Status: She is alert and oriented to person, place, and time.  Psychiatric:        Behavior: Behavior normal.     Results for orders placed or performed during the hospital encounter of 04/02/18  Comprehensive metabolic panel  Result Value Ref Range   Sodium 137 135 - 145 mmol/L   Potassium 4.2 3.5 - 5.1 mmol/L   Chloride 105 98 - 111 mmol/L   CO2 26 22 - 32 mmol/L   Glucose, Bld 102 (H) 70 - 99 mg/dL   BUN 16 6 - 20 mg/dL   Creatinine, Ser 1.610.73 0.44 - 1.00 mg/dL   Calcium 9.4 8.9 - 09.610.3 mg/dL   Total Protein 7.4 6.5 - 8.1 g/dL   Albumin 4.1 3.5 - 5.0 g/dL   AST 12 (L) 15 - 41 U/L   ALT 14 0 - 44 U/L   Alkaline Phosphatase 72 38 - 126 U/L   Total Bilirubin 0.4 0.3 - 1.2 mg/dL   GFR calc non Af Amer >60 >60 mL/min   GFR calc Af Amer >60 >60 mL/min   Anion gap 6 5 - 15  Lipid panel  Result Value Ref Range   Cholesterol 212 (H) 0 - 200 mg/dL   Triglycerides 045111 <409<150 mg/dL   HDL 51 >81>40 mg/dL   Total CHOL/HDL Ratio 4.2 RATIO   VLDL 22 0 - 40 mg/dL   LDL Cholesterol 191139 (H) 0 - 99 mg/dL      Assessment & Plan:   Encounter Diagnoses  Name Primary?  . Bipolar affective disorder, remission status unspecified (HCC) Yes  . Essential hypertension   . Hyperlipidemia, unspecified hyperlipidemia type   . Elevated platelet count     -reviewed labs with pt -pt is signed up for [facility] to get help with medication costs -will start lipitor for cholesterol.  Pt to follow lowfat diet -pt to follow up in 4 months (1 month to get lipitor and 3 months to be on it before rechecking labs).  Pt to RTO [place] prn

## 2018-04-10 NOTE — Patient Instructions (Signed)
Budget-Friendly Healthy Eating There are many ways to save money at the grocery store and continue to eat healthy. You can be successful if you:  Plan meals according to your budget.  Make a grocery list and only purchase food according to your grocery list.  Prepare food yourself. What are tips for following this plan?  Reading food labels  Compare food labels between brand name foods and the store brand. Often the nutritional value is the same, but the store brand is lower cost.  Look for products that do not have added sugar, fat, or salt (sodium). These often cost the same but are healthier for you. Products may be labeled as: ? Sugar-free. ? Nonfat. ? Low-fat. ? Sodium-free. ? Low-sodium.  Look for lean ground beef labeled as at least 92% lean and 8% fat. Shopping  Buy only the items on your grocery list and go only to the areas of the store that have the items on your list.  Use coupons only for foods and brands you normally buy. Avoid buying items you wouldn't normally buy simply because they are on sale.  Check online and in newspapers for weekly deals.  Buy healthy items from the bulk bins when available, such as herbs, spices, flour, pasta, nuts, and dried fruit.  Buy fruits and vegetables that are in season. Prices are usually lower on in-season produce.  Look at the unit price on the price tag. Use it to compare different brands and sizes to find out which item is the best deal.  Choose healthy items that are often low-cost, such as carrots, potatoes, apples, bananas, and oranges. Dried or canned beans are a low-cost protein source.  Buy in bulk and freeze extra food. Items you can buy in bulk include meats, fish, poultry, frozen fruits, and frozen vegetables.  Avoid buying "ready-to-eat" foods, such as pre-cut fruits and vegetables and pre-made salads.  If possible, shop around to discover where you can find the best prices. Consider other retailers such as  dollar stores, larger [employer], local fruit and vegetable stands, and farmers markets.  Do not shop when you are hungry. If you shop while hungry, it may be hard to stick to your list and budget.  Resist impulse buying. Use your grocery list as your official plan for the week.  Buy a variety of vegetables and fruits by purchasing fresh, frozen, and canned items.  Look at the top and bottom shelves for deals. Foods at eye level (eye level of an adult or child) are usually more expensive.  Be efficient with your time when shopping. The more time you spend at the store, the more money you are likely to spend.  To save money when choosing more expensive foods like meats and dairy: ? Choose cheaper cuts of meat, such as bone-in chicken thighs and drumsticks instead of skinless and boneless chicken. When you are ready to prepare the chicken, you can remove the skin yourself to make it healthier. ? Choose lean meats like chicken or [place] instead of beef. ? Choose canned seafood, such as tuna, salmon, or sardines. ? Buy eggs as a low-cost source of protein. ? Buy dried beans and peas, such as lentils, split peas, or kidney beans instead of meats. Dried beans and peas are a good alternative source of protein. ? Buy the larger tubs of yogurt instead of individual-sized containers.  Choose water instead of sodas and other sweetened beverages.  Avoid buying chips, cookies, and other "junk food." These  a good alternative source of protein.  ? Buy the larger tubs of yogurt instead of individual-sized containers.   Choose water instead of sodas and other sweetened beverages.   Avoid buying chips, cookies, and other "junk food." These items are usually expensive and not healthy.  Cooking   Make extra food and freeze the extras in meal-sized containers or in individual portions for fast meals and snacks.   Pre-cook on days when you have extra time to prepare meals in advance. You can keep these meals in the fridge or freezer and reheat for a quick meal.   When you come home from the grocery store, wash, peel, and cut fruits and vegetables so they are ready to use and eat. This will help reduce food waste.  Meal planning   Do  not eat out or get fast food. Prepare food at home.   Make a grocery list and make sure to bring it with you to the store. If you have a smart phone, you could use your phone to create your shopping list.   Plan meals and snacks according to a grocery list and budget you create.   Use leftovers in your meal plan for the week.   Look for recipes where you can cook once and make enough food for two meals.   Include budget-friendly meals like stews, casseroles, and stir-fry dishes.   Try some meatless meals or try "no cook" meals like salads.   Make sure that half your plate is filled with fruits or vegetables. Choose from fresh, frozen, or canned fruits and vegetables. If eating canned, remember to rinse them before eating. This will remove any excess salt added for packaging.  Summary   Eating healthy on a budget is possible if you plan your meals according to your budget, purchase according to your budget and grocery list, and prepare food yourself.   Tips for buying more food on a limited budget include buying generic brands, using coupons only for foods you normally buy, and buying healthy items from the bulk bins when available.   Tips for buying cheaper food to replace expensive food include choosing cheaper, lean cuts of meat, and buying dried beans and peas.  This information is not intended to replace advice given to you by your health care provider. Make sure you discuss any questions you have with your health care provider.  Document Released: 10/24/2013 Document Revised: 02/21/2017 Document Reviewed: 02/21/2017  Elsevier Interactive Patient Education  2019 [employer]

## 2018-05-16 ENCOUNTER — Ambulatory Visit (HOSPITAL_COMMUNITY): Payer: Medicaid Other

## 2018-06-11 ENCOUNTER — Other Ambulatory Visit: Payer: Self-pay | Admitting: Physician Assistant

## 2018-06-11 DIAGNOSIS — Z1231 Encounter for screening mammogram for malignant neoplasm of breast: Secondary | ICD-10-CM

## 2018-07-01 ENCOUNTER — Other Ambulatory Visit: Payer: Self-pay | Admitting: Physician Assistant

## 2018-07-01 MED ORDER — MELOXICAM 7.5 MG PO TABS: 7.5000 mg | ORAL_TABLET | Freq: Every day | ORAL | 0 refills | Status: DC | PRN

## 2018-07-11 ENCOUNTER — Other Ambulatory Visit: Payer: Self-pay | Admitting: Physician Assistant

## 2018-07-11 MED ORDER — PROPRANOLOL HCL ER 60 MG PO CP24: 60.0000 mg | ORAL_CAPSULE | Freq: Every day | ORAL | 4 refills | Status: DC

## 2018-07-25 ENCOUNTER — Other Ambulatory Visit: Payer: Self-pay | Admitting: Physician Assistant

## 2018-08-06 ENCOUNTER — Ambulatory Visit
Admission: RE | Admit: 2018-08-06 | Discharge: 2018-08-06 | Disposition: A | Discharge status: Home / Self Care | Payer: No Typology Code available for payment source | Source: Ambulatory Visit | Attending: Physician Assistant | Admitting: Physician Assistant

## 2018-08-06 ENCOUNTER — Other Ambulatory Visit: Payer: Self-pay

## 2018-08-06 DIAGNOSIS — Z1231 Encounter for screening mammogram for malignant neoplasm of breast: Secondary | ICD-10-CM

## 2018-08-07 ENCOUNTER — Other Ambulatory Visit (HOSPITAL_COMMUNITY)
Admission: RE | Admit: 2018-08-07 | Discharge: 2018-08-07 | Disposition: A | Discharge status: Home / Self Care | Payer: No Typology Code available for payment source | Source: Ambulatory Visit | Attending: Physician Assistant | Admitting: Physician Assistant

## 2018-08-07 DIAGNOSIS — R7989 Other specified abnormal findings of blood chemistry: Secondary | ICD-10-CM

## 2018-08-07 DIAGNOSIS — E785 Hyperlipidemia, unspecified: Secondary | ICD-10-CM

## 2018-08-07 DIAGNOSIS — I10 Essential (primary) hypertension: Secondary | ICD-10-CM

## 2018-08-07 LAB — COMPREHENSIVE METABOLIC PANEL
ALT: 18 U/L (ref 0–44)
AST: 12 U/L — ABNORMAL LOW (ref 15–41)
Albumin: 4 g/dL (ref 3.5–5.0)
Alkaline Phosphatase: 85 U/L (ref 38–126)
Anion gap: 8 (ref 5–15)
BUN: 13 mg/dL (ref 6–20)
CO2: 26 mmol/L (ref 22–32)
Calcium: 9.2 mg/dL (ref 8.9–10.3)
Chloride: 104 mmol/L (ref 98–111)
Creatinine, Ser: 0.67 mg/dL (ref 0.44–1.00)
GFR calc Af Amer: >60 mL/min (ref >60)
GFR calc non Af Amer: >60 mL/min (ref >60)
Glucose, Bld: 105 mg/dL — ABNORMAL HIGH (ref 70–99)
Potassium: 4.3 mmol/L (ref 3.5–5.1)
Sodium: 138 mmol/L (ref 135–145)
Total Bilirubin: 0.5 mg/dL (ref 0.3–1.2)
Total Protein: 7 g/dL (ref 6.5–8.1)

## 2018-08-07 LAB — CBC
HCT: 40.7 % (ref 36.0–46.0)
Hemoglobin: 12.7 g/dL (ref 12.0–15.0)
MCH: 29.5 pg (ref 26.0–34.0)
MCHC: 31.2 g/dL (ref 30.0–36.0)
MCV: 94.7 fL (ref 80.0–100.0)
Platelets: 369 10*3/uL (ref 150–400)
RBC: 4.3 MIL/uL (ref 3.87–5.11)
RDW: 12.1 % (ref 11.5–15.5)
WBC: 9.9 10*3/uL (ref 4.0–10.5)
nRBC: 0 % (ref 0.0–0.2)

## 2018-08-07 LAB — LIPID PANEL
Cholesterol: 133 mg/dL (ref 0–200)
HDL: 53 mg/dL (ref >40)
LDL Cholesterol: 70 mg/dL (ref 0–99)
Total CHOL/HDL Ratio: 2.5 RATIO
Triglycerides: 49 mg/dL (ref <150)
VLDL: 10 mg/dL (ref 0–40)

## 2018-08-08 ENCOUNTER — Encounter: Payer: Self-pay | Admitting: Physician Assistant

## 2018-08-08 ENCOUNTER — Other Ambulatory Visit: Payer: Self-pay | Admitting: Physician Assistant

## 2018-08-08 ENCOUNTER — Ambulatory Visit: Payer: Medicaid Other | Admitting: Physician Assistant

## 2018-08-08 DIAGNOSIS — E785 Hyperlipidemia, unspecified: Secondary | ICD-10-CM

## 2018-08-08 DIAGNOSIS — R4789 Other speech disturbances: Secondary | ICD-10-CM

## 2018-08-08 DIAGNOSIS — F319 Bipolar disorder, unspecified: Secondary | ICD-10-CM

## 2018-08-08 DIAGNOSIS — I10 Essential (primary) hypertension: Secondary | ICD-10-CM

## 2018-08-08 MED ORDER — HYDROCHLOROTHIAZIDE 12.5 MG PO CAPS: 12.5000 mg | ORAL_CAPSULE | Freq: Every day | ORAL | 0 refills | Status: DC

## 2018-08-08 MED ORDER — PROPRANOLOL HCL ER 60 MG PO CP24: 60.0000 mg | ORAL_CAPSULE | Freq: Every day | ORAL | 4 refills | Status: DC

## 2018-08-08 MED ORDER — LOSARTAN POTASSIUM 25 MG PO TABS: 25.0000 mg | ORAL_TABLET | Freq: Every day | ORAL | 0 refills | Status: DC

## 2018-08-08 MED ORDER — MELOXICAM 7.5 MG PO TABS: ORAL_TABLET | ORAL | 0 refills | Status: DC

## 2018-08-08 MED ORDER — ATORVASTATIN CALCIUM 20 MG PO TABS: 20.0000 mg | ORAL_TABLET | Freq: Every day | ORAL | 0 refills | Status: DC

## 2018-08-08 NOTE — Progress Notes (Signed)
LMP 12/18/2017 (Approximate)    Subjective:    Patient ID: Carolyn Daniels, female    DOB: 06/14/1970, 48 y.o.   MRN: [id number]  HPI: Carolyn Latinalizabeth S Daniels is a 48 y.o. female presenting on 08/08/2018 for No chief complaint on file.   HPI  This is a telemedicine appointment through [employer] due to coronavirus pandemic  I connected with  Carolyn Daniels on 08/08/18 by a video enabled telemedicine application and verified that I am speaking with the correct person using two identifiers.   I discussed the limitations of evaluation and management by telemedicine. The patient expressed understanding and agreed to proceed.  Pt is at home.  Provider is at office/clinic  Pt is married name is Lieser by ss card and ncdl is Daniels  Pt has no complaints today.  She says she is doing well  Relevant past medical, surgical, family and social history reviewed and updated as indicated. Interim medical history since our last visit reviewed. Allergies and medications reviewed and updated.   Current Outpatient Medications:  .  atorvastatin (LIPITOR) 20 MG tablet, Take 1 tablet (20 mg total) by mouth daily., Disp: 90 tablet, Rfl: 1 .  cetirizine (ZYRTEC) 10 MG tablet, Take 10 mg by mouth daily., Disp: , Rfl:  .  hydrochlorothiazide (MICROZIDE) 12.5 MG capsule, Take 1 capsule (12.5 mg total) by mouth daily., Disp: 90 capsule, Rfl: 1 .  losartan (COZAAR) 25 MG tablet, Take 1 tablet (25 mg total) by mouth daily., Disp: 90 tablet, Rfl: 1 .  meloxicam (MOBIC) 7.5 MG tablet, TAKE 1 TABLET BY MOUTH ONCE DAILY AS NEEDED FOR PAIN, Disp: 30 tablet, Rfl: 0 .  montelukast (SINGULAIR) 10 MG tablet, Take 1 tablet (10 mg total) by mouth at bedtime. (Patient taking differently: Take 10 mg by mouth daily. ), Disp: 30 tablet, Rfl: 4 .  multivitamin-lutein (OCUVITE-LUTEIN) CAPS capsule, Take 1 capsule by mouth daily., Disp: , Rfl:  .  Probiotic Product (PROBIOTIC DAILY PO), Take 1 tablet by mouth daily., Disp: ,  Rfl:  .  propranolol ER (INDERAL LA) 60 MG 24 hr capsule, Take 1 capsule (60 mg total) by mouth daily., Disp: 30 capsule, Rfl: 4 .  risperiDONE (RISPERDAL) 1 MG tablet, Take 1 mg by mouth at bedtime., Disp: , Rfl:    Review of Systems  Per HPI unless specifically indicated above     Objective:    LMP 12/18/2017 (Approximate)   Wt Readings from Last 3 Encounters:  04/10/18 193 lb (87.5 kg)  03/23/18 180 lb (81.6 kg)  01/30/18 192 lb 7.4 oz (87.3 kg)    Physical Exam Constitutional:      General: She is not in acute distress.    Appearance: She is not ill-appearing.  HENT:     Head: Normocephalic and atraumatic.  Pulmonary:     Effort: Pulmonary effort is normal. No respiratory distress.  Neurological:     Mental Status: She is alert and oriented to person, place, and time.  Psychiatric:        Attention and Perception: Attention normal.        Behavior: Behavior is cooperative.     Comments: Pt is pleasant and in good spirits.  She has her usual nonstop talking and must be interrupted in order to be asked question or discuss something.      Results for orders placed or performed during the hospital encounter of 08/07/18  CBC  Result Value Ref Range   WBC 9.9 4.0 -  10.5 K/uL   RBC 4.30 3.87 - 5.11 MIL/uL   Hemoglobin 12.7 12.0 - 15.0 g/dL   HCT 26.7 12.4 - 58.0 %   MCV 94.7 80.0 - 100.0 fL   MCH 29.5 26.0 - 34.0 pg   MCHC 31.2 30.0 - 36.0 g/dL   RDW 99.8 33.8 - 25.0 %   Platelets 369 150 - 400 K/uL   nRBC 0.0 0.0 - 0.2 %  Lipid panel  Result Value Ref Range   Cholesterol 133 0 - 200 mg/dL   Triglycerides 49 <539 mg/dL   HDL 53 >76 mg/dL   Total CHOL/HDL Ratio 2.5 RATIO   VLDL 10 0 - 40 mg/dL   LDL Cholesterol 70 0 - 99 mg/dL  Comprehensive metabolic panel  Result Value Ref Range   Sodium 138 135 - 145 mmol/L   Potassium 4.3 3.5 - 5.1 mmol/L   Chloride 104 98 - 111 mmol/L   CO2 26 22 - 32 mmol/L   Glucose, Bld 105 (H) 70 - 99 mg/dL   BUN 13 6 - 20 mg/dL    Creatinine, Ser 7.34 0.44 - 1.00 mg/dL   Calcium 9.2 8.9 - 19.3 mg/dL   Total Protein 7.0 6.5 - 8.1 g/dL   Albumin 4.0 3.5 - 5.0 g/dL   AST 12 (L) 15 - 41 U/L   ALT 18 0 - 44 U/L   Alkaline Phosphatase 85 38 - 126 U/L   Total Bilirubin 0.5 0.3 - 1.2 mg/dL   GFR calc non Af Amer >60 >60 mL/min   GFR calc Af Amer >60 >60 mL/min   Anion gap 8 5 - 15      Assessment & Plan:    Encounter Diagnoses  Name Primary?  . Essential hypertension Yes  . Hyperlipidemia, unspecified hyperlipidemia type   . Bipolar affective disorder, remission status unspecified (HCC)   . Logorrhea     -reviewed labs with pt -refilled rx -discussed with pt cannot rx antipsychotic drug (respirdone)- she needs mental health to do that.  She says [facility] told her she didn't need to go there.  She says she "is a mess" without it.   -pt to follow up here 3 months.  Contact office sooner prn

## 2018-09-02 ENCOUNTER — Other Ambulatory Visit: Payer: Self-pay | Admitting: Physician Assistant

## 2018-09-02 MED ORDER — MELOXICAM 7.5 MG PO TABS: ORAL_TABLET | ORAL | 0 refills | Status: DC
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
## 2018-10-31 ENCOUNTER — Encounter (HOSPITAL_COMMUNITY): Payer: Self-pay | Admitting: Psychiatry

## 2018-10-31 ENCOUNTER — Ambulatory Visit (INDEPENDENT_AMBULATORY_CARE_PROVIDER_SITE_OTHER): Payer: Medicaid - Out of State | Admitting: Psychiatry

## 2018-10-31 ENCOUNTER — Other Ambulatory Visit: Payer: Self-pay

## 2018-10-31 DIAGNOSIS — F3175 Bipolar disorder, in partial remission, most recent episode depressed: Secondary | ICD-10-CM | POA: Diagnosis not present

## 2018-10-31 MED ORDER — RISPERIDONE 1 MG PO TABS: 1.0000 mg | ORAL_TABLET | Freq: Every day | ORAL | 0 refills | Status: DC

## 2018-10-31 NOTE — Patient Instructions (Signed)
1. Continue risperidone 1 mg at night  2. Next appointment: 10/26 at 1 PM

## 2018-11-07 ENCOUNTER — Other Ambulatory Visit: Payer: Self-pay | Admitting: Physician Assistant

## 2018-11-13 ENCOUNTER — Telehealth (HOSPITAL_COMMUNITY): Payer: Self-pay | Admitting: *Deleted
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
## 2018-11-13 NOTE — Telephone Encounter (Signed)
Noted, thanks. Please advise her to consider doing visit where she can have more privacy if possible.

## 2018-11-14 ENCOUNTER — Other Ambulatory Visit (HOSPITAL_COMMUNITY)
Admission: RE | Admit: 2018-11-14 | Discharge: 2018-11-14 | Disposition: A | Discharge status: Home / Self Care | Payer: No Typology Code available for payment source | Source: Ambulatory Visit | Attending: Physician Assistant | Admitting: Physician Assistant

## 2018-11-14 DIAGNOSIS — I10 Essential (primary) hypertension: Secondary | ICD-10-CM

## 2018-11-14 DIAGNOSIS — E785 Hyperlipidemia, unspecified: Secondary | ICD-10-CM | POA: Insufficient documentation

## 2018-11-14 LAB — COMPREHENSIVE METABOLIC PANEL
ALT: 15 U/L (ref 0–44)
AST: 12 U/L — ABNORMAL LOW (ref 15–41)
Albumin: 4.1 g/dL (ref 3.5–5.0)
Alkaline Phosphatase: 78 U/L (ref 38–126)
Anion gap: 9 (ref 5–15)
BUN: 15 mg/dL (ref 6–20)
CO2: 23 mmol/L (ref 22–32)
Calcium: 8.8 mg/dL — ABNORMAL LOW (ref 8.9–10.3)
Chloride: 104 mmol/L (ref 98–111)
Creatinine, Ser: 0.63 mg/dL (ref 0.44–1.00)
GFR calc Af Amer: >60 mL/min (ref >60)
GFR calc non Af Amer: >60 mL/min (ref >60)
Glucose, Bld: 96 mg/dL (ref 70–99)
Potassium: 3.9 mmol/L (ref 3.5–5.1)
Sodium: 136 mmol/L (ref 135–145)
Total Bilirubin: 0.8 mg/dL (ref 0.3–1.2)
Total Protein: 7.4 g/dL (ref 6.5–8.1)

## 2018-11-14 LAB — LIPID PANEL
Cholesterol: 141 mg/dL (ref 0–200)
HDL: 46 mg/dL (ref >40)
LDL Cholesterol: 80 mg/dL (ref 0–99)
Total CHOL/HDL Ratio: 3.1 RATIO
Triglycerides: 73 mg/dL (ref <150)
VLDL: 15 mg/dL (ref 0–40)

## 2018-11-18 ENCOUNTER — Encounter: Payer: Self-pay | Admitting: Physician Assistant

## 2018-11-18 ENCOUNTER — Ambulatory Visit: Payer: Medicaid Other | Admitting: Physician Assistant

## 2018-11-18 VITALS — BP 126/75

## 2018-11-18 DIAGNOSIS — F319 Bipolar disorder, unspecified: Secondary | ICD-10-CM

## 2018-11-18 DIAGNOSIS — B353 Tinea pedis: Secondary | ICD-10-CM

## 2018-11-18 DIAGNOSIS — E785 Hyperlipidemia, unspecified: Secondary | ICD-10-CM

## 2018-11-18 DIAGNOSIS — G43C0 Periodic headache syndromes in child or adult, not intractable: Secondary | ICD-10-CM

## 2018-11-18 DIAGNOSIS — I10 Essential (primary) hypertension: Secondary | ICD-10-CM

## 2018-11-18 DIAGNOSIS — R7303 Prediabetes: Secondary | ICD-10-CM

## 2018-11-18 MED ORDER — CLOTRIMAZOLE-BETAMETHASONE 1-0.05 % EX CREA: 1.0000 "application " | TOPICAL_CREAM | Freq: Two times a day (BID) | CUTANEOUS | 0 refills | Status: DC

## 2018-11-18 NOTE — Progress Notes (Signed)
LMP 12/18/2017 (Approximate)    Subjective:    Patient ID: Carolyn Daniels, female    DOB: 09/17/1970, 48 y.o.   MRN: [id number]  HPI: Carolyn Daniels is a 48 y.o. female presenting on 11/18/2018 for No chief complaint on file.   HPI   This is a telemedicine appointment through [facility] due to coronavirus pandemic.  I connected with  Carolyn Daniels on 11/18/18 by a video enabled telemedicine application and verified that I am speaking with the correct person using two identifiers.   I discussed the limitations of evaluation and management by telemedicine. The patient expressed understanding and agreed to proceed.  Pt is at home.  Provider is at office.      Pt says she is doing well.    She says the mobic is working well for when she has a headache.      Pt complains of some Right foot dry skin and itching and peeling skin.  She has been Using avon lotion on it and has also tried neosporin without any improvement.  She says there is no rash or bumps, just the dry peeling skin.  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    She has a bp machine at home- she checked it yesterday and it was 126/75  She is still seeing dr Modesta Messing for [facility] issues.  She says that is going well also.   She says she has no other complaints.  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       Relevant past medical, surgical, family and social history reviewed and updated as indicated. Interim medical history since our last visit reviewed. Allergies and medications reviewed and updated.   Current Outpatient Medications:  .  atorvastatin (LIPITOR) 20 MG tablet, TAKE 1 Tablet BY MOUTH ONCE DAILY, Disp: 90 tablet, Rfl: 1 .  cetirizine (ZYRTEC) 10 MG tablet, Take 10 mg by mouth daily., Disp: , Rfl:  .  hydrochlorothiazide (HYDRODIURIL) 25 MG tablet, TAKE 1/2 Tablet BY MOUTH EVERY DAY, Disp: 45 tablet, Rfl: 1 .  losartan (COZAAR) 25 MG tablet, TAKE 1 Tablet BY MOUTH  ONCE DAILY, Disp: 90 tablet, Rfl: 0 .  LUTEIN PO, Take 1 tablet by mouth daily., Disp: , Rfl:  .  meloxicam (MOBIC) 15 MG tablet, TAKE 1/2 Tablet BY MOUTH ONCE EVERY DAY AS NEEDED FOR PAIN, Disp: 45 tablet, Rfl: 0 .  montelukast (SINGULAIR) 10 MG tablet, Take 1 tablet (10 mg total) by mouth at bedtime. (Patient taking differently: Take 10 mg by mouth daily. ), Disp: 30 tablet, Rfl: 4 .  multivitamin-lutein (OCUVITE-LUTEIN) CAPS capsule, Take 1 capsule by mouth daily., Disp: , Rfl:  .  Probiotic Product (PROBIOTIC DAILY PO), Take 1 tablet by mouth daily., Disp: , Rfl:  .  propranolol ER (INDERAL LA) 60 MG 24 hr capsule, Take 1 capsule (60 mg total) by mouth daily., Disp: 30 capsule, Rfl: 4 .  risperiDONE (RISPERDAL) 1 MG tablet, Take 1 tablet (1 mg total) by mouth at bedtime., Disp: 90 tablet, Rfl: 0    Review of Systems  Per HPI unless specifically indicated above     Objective:    LMP 12/18/2017 (Approximate)   Wt Readings from Last 3 Encounters:  04/10/18 193 lb (87.5 kg)  03/23/18 180 lb (81.6 kg)  01/30/18 192 lb 7.4 oz (87.3 kg)    Physical Exam Constitutional:      General: She is not in acute distress.  Appearance: Normal appearance. She is not ill-appearing.  HENT:     Head: Normocephalic and atraumatic.  Neurological:     Mental Status: She is alert and oriented to person, place, and time.  Psychiatric:        Attention and Perception: Attention normal.        Behavior: Behavior is cooperative.     Comments: Pt is at her baseline     Results for orders placed or performed during the hospital encounter of 11/14/18  Lipid panel  Result Value Ref Range   Cholesterol 141 0 - 200 mg/dL   Triglycerides 73 <161<150 mg/dL   HDL 46 >09>40 mg/dL   Total CHOL/HDL Ratio 3.1 RATIO   VLDL 15 0 - 40 mg/dL   LDL Cholesterol 80 0 - 99 mg/dL  Comprehensive metabolic panel  Result Value Ref Range   Sodium 136 135 - 145 mmol/L   Potassium 3.9 3.5 - 5.1 mmol/L   Chloride 104 98 -  111 mmol/L   CO2 23 22 - 32 mmol/L   Glucose, Bld 96 70 - 99 mg/dL   BUN 15 6 - 20 mg/dL   Creatinine, Ser 6.040.63 0.44 - 1.00 mg/dL   Calcium 8.8 (L) 8.9 - 10.3 mg/dL   Total Protein 7.4 6.5 - 8.1 g/dL   Albumin 4.1 3.5 - 5.0 g/dL   AST 12 (L) 15 - 41 U/L   ALT 15 0 - 44 U/L   Alkaline Phosphatase 78 38 - 126 U/L   Total Bilirubin 0.8 0.3 - 1.2 mg/dL   GFR calc non Af Amer >60 >60 mL/min   GFR calc Af Amer >60 >60 mL/min   Anion gap 9 5 - 15      Assessment & Plan:    Encounter Diagnoses  Name Primary?  . Essential hypertension Yes  . Hyperlipidemia, unspecified hyperlipidemia type   . Periodic headache syndrome, not intractable   . Bipolar affective disorder, remission status unspecified (HCC)   . Prediabetes   . Tinea pedis of right foot       -Reviewed labs with pt  -dyslipidemia- pt to continue lowfat diet and atorvastatin  -HTN- pt to continue current rx  -HCM: mammogram UTD Pap UTD (done 2018 next 2023)  -tinea pedis rx lotrisone and spply to R foot.  [facility] with coupon  -HEADACHE  pt to continue propranolol for preventative and mobic prn  -MH Pt to continue with dr Vanetta ShawlHisada   -pt to follow up early January.  Will give iFOBT for colon cancer screening at that time as she will be 49yo then.  Pt to contact office sooner prn

## 2018-11-21 ENCOUNTER — Other Ambulatory Visit (HOSPITAL_COMMUNITY): Payer: Self-pay | Admitting: Psychiatry

## 2018-11-21 ENCOUNTER — Telehealth (HOSPITAL_COMMUNITY): Payer: Self-pay | Admitting: *Deleted

## 2018-11-21 MED ORDER — RISPERDAL 1 MG PO TABS: 1.0000 mg | ORAL_TABLET | Freq: Every day | ORAL | 0 refills | Status: DC

## 2018-11-21 NOTE — Telephone Encounter (Signed)
Done per provider: Ordered Risperdal (patient notified). Please call [facility] #[phone] - [place] to cancel remaining refill of risperidone

## 2018-11-21 NOTE — Telephone Encounter (Signed)
When spoke with patient she noted that due to the manufacturer she's not sleeping as well & asked for sooner refill/brand from [facility]? She's down to 3 days left

## 2018-11-21 NOTE — Telephone Encounter (Signed)
Do you mean that she would like to have sooner refill/brand from [facility]? I ordered 90 days of risperdal in August, and insurance may not cover the new prescription, although I can still send the order if she wishes.

## 2018-11-21 NOTE — Telephone Encounter (Signed)
PATIENT CALLED  & LVM STATING THE MANUFACTURE THAT [facility] IS GETTING THE RISPERDAL FROM  SHE'S NOT SLEEPING WELL @ ALL & STARTING TO GET SOME PARANOIA. HAS CHANGED PREFERRED Rx  STATES SHE HAS 3 DAYS LEFT ON HER MED & THAT SHE DID PICK MED ON  10/31/2018. SHE WOULD LIKE TO KNOW USE [facility] UPDATED IN EPIC

## 2018-11-21 NOTE — Telephone Encounter (Signed)
Ordered. Please call [facility] #[phone] - [place] to cancel remaining refill of risperidone.

## 2018-11-28 ENCOUNTER — Encounter (HOSPITAL_COMMUNITY): Payer: Self-pay | Admitting: Emergency Medicine

## 2018-11-28 ENCOUNTER — Other Ambulatory Visit: Payer: Self-pay

## 2018-11-28 DIAGNOSIS — I10 Essential (primary) hypertension: Secondary | ICD-10-CM | POA: Insufficient documentation

## 2018-11-28 DIAGNOSIS — J45909 Unspecified asthma, uncomplicated: Secondary | ICD-10-CM | POA: Insufficient documentation

## 2018-11-28 DIAGNOSIS — Z79899 Other long term (current) drug therapy: Secondary | ICD-10-CM | POA: Insufficient documentation

## 2018-11-28 NOTE — ED Triage Notes (Signed)
Pt has been off of her propanolol 60 mg for 2 days and is now having chest tightness. BP 155/80 in triage. Came yesterday but left before checking in due to the wait.

## 2018-11-29 ENCOUNTER — Emergency Department (HOSPITAL_COMMUNITY)
Admission: EM | Admit: 2018-11-29 | Discharge: 2018-11-29 | Disposition: A | Discharge status: Home / Self Care | Payer: No Typology Code available for payment source | Attending: Emergency Medicine | Admitting: Emergency Medicine

## 2018-11-29 DIAGNOSIS — I10 Essential (primary) hypertension: Secondary | ICD-10-CM

## 2018-11-29 NOTE — ED Notes (Signed)
Pt reports clinic closed. Pt has HCTZ and losartan, but does not have Propanolol. Per Dr Reather Converse take 2 BP meds and follow up with clininc on monday

## 2018-11-29 NOTE — ED Provider Notes (Signed)
[facility] EMERGENCY DEPARTMENT Provider Note   CSN: [id number] Arrival date & time: 11/28/18  2139   Time seen 1:40 AM  History   Chief Complaint Chief Complaint  Patient presents with  . Anxiety    HPI Carolyn Daniels is a 48 y.o. female.     HPI patient is here because she has run out of her propranolol ER that she has been on for the past 2 years for her hypertension.  Patient is very difficult to keep on track about what we are discussing.  She starts questioning me about the process about getting her husband into a nursing home.  I advised her she need to discuss that with his doctor and she has already done that, Dr. Neta Mends in [place] is arranging for him to have a dementia test done in December.  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  She states all of her medications come from a low-cost pharmacy in [place] except for the propranolol ER which she has to buy locally.  She states the regular prescription is about $50.  She shows me in her billfold she only has $7 to last until next week when she gets paid and she needs to buy gas for her vehicle.  She has a prescription at the drugstore that she is unable to afford to pick up.  Patient states she is taking her risperdal.  She wants reassurance that she is coping okay to take care of her husband.  He is wheelchair bound now but he is able to dress himself and feed himself.  She states no family can come help her, and no one from her church will come help her because a COVID.  Patient states she has had some headache and chest pain however she wanders from topic to topic so I do not feel like it is a major issue.  PCP Soyla Dryer, PA-C   Past Medical History:   Diagnosis Date  . Allergy   . Anemia   . Arthritis   . Asthma   . Bipolar 1 disorder ([place])   . Depression   . GERD (gastroesophageal reflux disease)   . Heel spur   . Hypertension   . Migraines   . Migraines   . Pre-diabetes   . Scoliosis     Patient Active Problem List   Diagnosis Date Noted  . Essential hypertension 12/03/2017  . Bipolar affective disorder ([place]) 12/03/2017  . Adjustment disorder with emotional disturbance 09/25/2017    Past Surgical History:  Procedure Laterality Date  . CESAREAN SECTION    . RHINOPLASTY  01/2017  . WISDOM TOOTH EXTRACTION Bilateral      OB History   No obstetric history on file.      Home Medications    Prior to Admission medications   Medication Sig Start Date End Date Taking? Authorizing Provider  atorvastatin (LIPITOR) 20 MG tablet TAKE 1 Tablet BY MOUTH ONCE DAILY 11/07/18   Soyla Dryer, PA-C  cetirizine (ZYRTEC) 10 MG tablet Take 10 mg by mouth daily.    [provider]  clotrimazole-betamethasone (LOTRISONE) cream Apply 1 application topically 2 (two) times daily. 11/18/18   Soyla Dryer, PA-C  hydrochlorothiazide (HYDRODIURIL) 25 MG tablet TAKE 1/2  Tablet BY MOUTH EVERY DAY 11/07/18   Jacquelin Hawking, PA-C  losartan (COZAAR) 25 MG tablet TAKE 1 Tablet BY MOUTH ONCE DAILY 11/07/18   Jacquelin Hawking, PA-C  LUTEIN PO Take 1 tablet by mouth daily.    [provider]  meloxicam (MOBIC) 15 MG tablet TAKE 1/2 Tablet BY MOUTH ONCE EVERY DAY AS NEEDED FOR PAIN 11/07/18   Jacquelin Hawking, PA-C  montelukast (SINGULAIR) 10 MG tablet Take 1 tablet (10 mg total) by mouth at bedtime. Patient taking differently: Take 10 mg by mouth daily.  01/08/18   Jacquelin Hawking, PA-C  multivitamin-lutein (OCUVITE-LUTEIN) CAPS capsule Take 1 capsule by mouth daily.    [provider]  Probiotic Product (PROBIOTIC DAILY PO) Take 1 tablet by mouth daily.    [provider]  propranolol ER (INDERAL LA) 60 MG 24 hr  capsule Take 1 capsule (60 mg total) by mouth daily. 08/08/18   Jacquelin Hawking, PA-C  RISPERDAL 1 MG tablet Take 1 tablet (1 mg total) by mouth at bedtime. 11/21/18   Neysa Hotter, MD  risperiDONE (RISPERDAL) 1 MG tablet Take 1 tablet (1 mg total) by mouth at bedtime. 10/31/18   Neysa Hotter, MD    Family History Family History  Problem Relation Age of Onset  . Stroke Maternal Grandfather   . Hypertension Maternal Grandfather   . Depression Sister     Social History Social History   Tobacco Use  . Smoking status: Never Smoker  . Smokeless tobacco: Never Used  Substance Use Topics  . Alcohol use: Never    Frequency: Never  . Drug use: Not Currently  on disability   Allergies   Augmentin [amoxicillin-pot clavulanate], Depakote er [divalproex sodium er], Lamictal [lamotrigine], Sudafed [pseudoephedrine hcl], Sulfamethoxazole, and Topamax [topiramate]   Review of Systems Review of Systems  All other systems reviewed and are negative.    Physical Exam Updated Vital Signs BP (!) 155/80 (BP Location: Right Arm)   Pulse 87   Temp 98.1 F (36.7 C) (Oral)   Resp 18   Ht 5\' 5"  (1.651 m)   Wt 86.2 kg   LMP 12/18/2017 (Approximate)   SpO2 99%   BMI 31.62 kg/m   Physical Exam Vitals signs and nursing note reviewed.  Constitutional:      General: She is not in acute distress.    Appearance: Normal appearance. She is well-developed. She is not ill-appearing or toxic-appearing.  HENT:     Head: Normocephalic and atraumatic.     Right Ear: External ear normal.     Left Ear: External ear normal.     Nose: Nose normal. No mucosal edema or rhinorrhea.     Mouth/Throat:     Mouth: Mucous membranes are moist.     Dentition: No dental abscesses.     Pharynx: No oropharyngeal exudate, posterior oropharyngeal erythema or uvula swelling.  Eyes:     Extraocular Movements: Extraocular movements intact.     Conjunctiva/sclera: Conjunctivae normal.     Pupils: Pupils are equal,  round, and reactive to light.  Neck:     Musculoskeletal: Full passive range of motion without pain, normal range of motion and neck supple.  Cardiovascular:     Rate and Rhythm: Normal rate and regular rhythm.     Heart sounds: Normal heart sounds. No murmur. No friction rub. No gallop.   Pulmonary:     Effort: Pulmonary effort is normal. No respiratory distress.     Breath sounds: Normal breath sounds. No wheezing,  rhonchi or rales.  Chest:     Chest wall: No tenderness or crepitus.  Abdominal:     General: Bowel sounds are normal. There is no distension.     Palpations: Abdomen is soft.     Tenderness: There is no abdominal tenderness. There is no guarding or rebound.  Musculoskeletal: Normal range of motion.        General: No tenderness.     Comments: Moves all extremities well.   Skin:    General: Skin is warm and dry.     Coloration: Skin is not pale.     Findings: No erythema or rash.  Neurological:     Mental Status: She is alert and oriented to person, place, and time.     Cranial Nerves: No cranial nerve deficit.  Psychiatric:        Attention and Perception: She does not perceive auditory or visual hallucinations.        Mood and Affect: Mood is anxious.        Speech: Speech is rapid and pressured.        Behavior: Behavior is hyperactive.        Thought Content: Thought content is not paranoid or delusional. Thought content does not include homicidal or suicidal ideation.      ED Treatments / Results  Labs (all labs ordered are listed, but only abnormal results are displayed) Labs Reviewed - No data to display  EKG None  Radiology No results found.  Procedures Procedures (including critical care time)  Medications Ordered in ED Medications - No data to display   Initial Impression / Assessment and Plan / ED Course  I have reviewed the triage vital signs and the nursing notes.  Pertinent labs & imaging results that were available during my care of  the patient were reviewed by me and considered in my medical decision making (see chart for details).        Patient states she had a good Rx card however [facility] does not accept good Rx anymore.  When I look online it looks like [facility] will honor a good Rx card and the prescription would be about $24.  However patient does not have more than $7 at her disposal right now.  I had suggested she contact her church which she already has and they are already helping her get her medications filled.  I advised her to get back in contact with her providers at the free clinic to see if they want to change her medication.  There is no point in me writing her something tonight because she is not could be able to afford anything.  Her blood pressure is high but it is not dangerously high.  Final Clinical Impressions(s) / ED Diagnoses   Final diagnoses:  Essential hypertension    ED Discharge Orders    None     Plan discharge  Devoria AlbeIva Shallyn Constancio, MD, Concha PyoFACEP    Terita Hejl, MD 11/29/18 [phone]

## 2018-11-29 NOTE — Discharge Instructions (Addendum)
Please call your provider at the free clinic or go to the clinic tomorrow to discuss your blood pressure medication.  Unfortunately you do not have enough money to buy any medication even if I prescribed you something different tonight.  Talk to your doctor and explain you cannot afford the propranolol ER.  The regular propranolol would be much cheaper but you might have to take it twice a day.

## 2018-11-29 NOTE — ED Notes (Signed)
Pt rec'd d/c papers. Pt did not sign pt also did not want v/s retaken. Pt left with belongings and papers. Pt aware to go to pcp as directed.

## 2018-12-02 ENCOUNTER — Ambulatory Visit: Payer: Medicaid Other | Admitting: Physician Assistant

## 2018-12-14 ENCOUNTER — Other Ambulatory Visit: Payer: Self-pay | Admitting: Physician Assistant
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
## 2018-12-30 ENCOUNTER — Other Ambulatory Visit: Payer: Self-pay

## 2018-12-30 ENCOUNTER — Encounter (HOSPITAL_COMMUNITY): Payer: Self-pay | Admitting: Psychiatry

## 2018-12-30 ENCOUNTER — Ambulatory Visit (INDEPENDENT_AMBULATORY_CARE_PROVIDER_SITE_OTHER): Payer: Medicaid - Out of State | Admitting: Psychiatry

## 2018-12-30 DIAGNOSIS — F4310 Post-traumatic stress disorder, unspecified: Secondary | ICD-10-CM | POA: Diagnosis not present

## 2018-12-30 DIAGNOSIS — F3175 Bipolar disorder, in partial remission, most recent episode depressed: Secondary | ICD-10-CM | POA: Diagnosis not present

## 2018-12-30 MED ORDER — RISPERIDONE 1 MG PO TABS: 1.0000 mg | ORAL_TABLET | Freq: Every day | ORAL | 0 refills | Status: DC

## 2018-12-30 NOTE — Patient Instructions (Addendum)
1. Continue risperidone 1 mg at night  2. Next appointment in January 3. Obtain blood test (thyroid)

## 2018-12-30 NOTE — Addendum Note (Signed)
Addended by: [name] Clay on: 12/30/2018 01:55 PM   Modules accepted: Orders

## 2019-01-14 ENCOUNTER — Other Ambulatory Visit: Payer: Self-pay | Admitting: Physician Assistant

## 2019-01-14 MED ORDER — MELOXICAM 15 MG PO TABS: ORAL_TABLET | ORAL | 0 refills | Status: DC

## 2019-01-27 ENCOUNTER — Other Ambulatory Visit: Payer: Self-pay | Admitting: Physician Assistant

## 2019-02-03 ENCOUNTER — Other Ambulatory Visit: Payer: Self-pay | Admitting: Physician Assistant

## 2019-02-03 MED ORDER — MONTELUKAST SODIUM 10 MG PO TABS: 10.0000 mg | ORAL_TABLET | Freq: Every day | ORAL | 1 refills | Status: DC

## 2019-02-03 MED ORDER — CETIRIZINE HCL 10 MG PO TABS: 10.0000 mg | ORAL_TABLET | Freq: Every day | ORAL | 0 refills | Status: DC

## 2019-02-11 ENCOUNTER — Telehealth (HOSPITAL_COMMUNITY): Payer: Self-pay | Admitting: *Deleted

## 2019-02-11 NOTE — Telephone Encounter (Signed)
PATIENT CALLED X 2 LVM REQUESTING NEW SCRIPT BE SENT TO [facility] FOR HER RISPERIDONE  1 MG TABLET. STATED [place] MED ASSIST SAID SCRIPT IS LOST. SPOKE WITH [place] MED ASSIST Rx  & PATIENT'S SCRIPT WAS FILLED ON  02/04/2019 & TRACKING SHOWS IT IN [place] [facility] MAILED ORDER SCRIPT SHOULD ARRIVE IN 1/2 DAYS.Marland Kitchen  PATIENT NOTIFIED

## 2019-02-12 ENCOUNTER — Telehealth (HOSPITAL_COMMUNITY): Payer: Self-pay | Admitting: *Deleted

## 2019-02-12 ENCOUNTER — Other Ambulatory Visit (HOSPITAL_COMMUNITY): Payer: Self-pay | Admitting: Psychiatry

## 2019-02-12 MED ORDER — RISPERIDONE 1 MG PO TABS: 1.0000 mg | ORAL_TABLET | Freq: Every day | ORAL | 0 refills | Status: DC

## 2019-02-12 MED ORDER — RISPERDAL 1 MG PO TABS: 1.0000 mg | ORAL_TABLET | Freq: Every day | ORAL | 0 refills | Status: DC

## 2019-02-12 NOTE — Telephone Encounter (Signed)
I just said I sent a 7 day supply to [facility]

## 2019-02-12 NOTE — Telephone Encounter (Signed)
Yes Thank You per Rx needs to be Generic Brand Risperidone per [facility]

## 2019-02-12 NOTE — Telephone Encounter (Signed)
7 days supply sent to [facility]

## 2019-02-12 NOTE — Telephone Encounter (Signed)
resent

## 2019-02-12 NOTE — Telephone Encounter (Signed)
PATIENT HAS CALLED & LVM  X 2 TODAY. STATING THAT SHE DID NOT RECEIVE HER MEDICATION BY MAIL TODAY FROM [place] MED ASSIST  & WILL USE A GOOD Rx COUPON CARD SHE WILL BE USING TO PAY CASH ASKING IF SHE COULD JUST HAVE 1 WEEKS' SUPPLY UNTIL HER MEDICATION ARRIVES. PATIENT GOES ON & ON ABOUT NOT HAVING TAKING HER MEDICATION IN A FEW DAYS & IF SHE WOULD HAVE TO GO TO  THE HOSPITAL NO ONE CAN TAKE CARE OF HER HUSBAND WHO HAS HAD A STROKE  & CAN'T BE LEFT ALONE & THAT IT'S NOT GOOD IF SHE WOULD HAVE TO GO TO [place] FOR MISSING HER MEDICATION. PREVIOUS  CONVERSATION WITH PATIENT ON YESTERDAY: PATIENT CALLED X 2 LVM REQUESTING NEW SCRIPT BE SENT TO [facility] FOR HER RISPERIDONE  1 MG TABLET. STATED [place] MED ASSIST SAID SCRIPT IS LOST. SPOKE WITH [place] MED ASSIST Rx  & PATIENT'S SCRIPT WAS FILLED ON  02/04/2019 & TRACKING SHOWS IT IN [place] [facility] MAILED ORDER SCRIPT SHOULD ARRIVE IN 1/2 DAYS.Marland Kitchen  PATIENT NOTIFIED

## 2019-02-12 NOTE — Telephone Encounter (Signed)
Dr Harrington Challenger can you resend for risperidone per [facility]

## 2019-02-17 ENCOUNTER — Other Ambulatory Visit: Payer: Self-pay | Admitting: Physician Assistant

## 2019-02-17 MED ORDER — LOSARTAN POTASSIUM 25 MG PO TABS: 25.0000 mg | ORAL_TABLET | Freq: Every day | ORAL | 4 refills | Status: DC

## 2019-03-17 ENCOUNTER — Other Ambulatory Visit: Payer: Self-pay

## 2019-03-17 ENCOUNTER — Other Ambulatory Visit (HOSPITAL_COMMUNITY)
Admission: RE | Admit: 2019-03-17 | Discharge: 2019-03-17 | Disposition: A | Discharge status: Home / Self Care | Payer: No Typology Code available for payment source | Source: Ambulatory Visit | Attending: Physician Assistant | Admitting: Physician Assistant

## 2019-03-17 DIAGNOSIS — R7303 Prediabetes: Secondary | ICD-10-CM

## 2019-03-17 DIAGNOSIS — I10 Essential (primary) hypertension: Secondary | ICD-10-CM

## 2019-03-17 DIAGNOSIS — E785 Hyperlipidemia, unspecified: Secondary | ICD-10-CM

## 2019-03-17 LAB — LIPID PANEL
Cholesterol: 148 mg/dL (ref 0–200)
HDL: 44 mg/dL (ref >40)
LDL Cholesterol: 74 mg/dL (ref 0–99)
Total CHOL/HDL Ratio: 3.4 RATIO
Triglycerides: 150 mg/dL — ABNORMAL HIGH (ref <150)
VLDL: 30 mg/dL (ref 0–40)

## 2019-03-17 LAB — COMPREHENSIVE METABOLIC PANEL
ALT: 16 U/L (ref 0–44)
AST: 12 U/L — ABNORMAL LOW (ref 15–41)
Albumin: 3.9 g/dL (ref 3.5–5.0)
Alkaline Phosphatase: 82 U/L (ref 38–126)
Anion gap: 8 (ref 5–15)
BUN: 17 mg/dL (ref 6–20)
CO2: 24 mmol/L (ref 22–32)
Calcium: 9.2 mg/dL (ref 8.9–10.3)
Chloride: 105 mmol/L (ref 98–111)
Creatinine, Ser: 0.61 mg/dL (ref 0.44–1.00)
GFR calc Af Amer: >60 mL/min (ref >60)
GFR calc non Af Amer: >60 mL/min (ref >60)
Glucose, Bld: 88 mg/dL (ref 70–99)
Potassium: 3.7 mmol/L (ref 3.5–5.1)
Sodium: 137 mmol/L (ref 135–145)
Total Bilirubin: 0.6 mg/dL (ref 0.3–1.2)
Total Protein: 7.2 g/dL (ref 6.5–8.1)

## 2019-03-17 LAB — HEMOGLOBIN A1C
Hgb A1c MFr Bld: 6 % — ABNORMAL HIGH (ref 4.8–5.6)
Mean Plasma Glucose: 125.5 mg/dL

## 2019-03-18 ENCOUNTER — Ambulatory Visit: Payer: Medicaid Other | Admitting: Physician Assistant

## 2019-03-27 ENCOUNTER — Encounter: Payer: Self-pay | Admitting: Physician Assistant

## 2019-03-27 ENCOUNTER — Ambulatory Visit: Payer: Medicaid Other | Admitting: Physician Assistant

## 2019-03-27 VITALS — BP 135/65 | HR 71

## 2019-03-27 DIAGNOSIS — F319 Bipolar disorder, unspecified: Secondary | ICD-10-CM

## 2019-03-27 DIAGNOSIS — E785 Hyperlipidemia, unspecified: Secondary | ICD-10-CM

## 2019-03-27 DIAGNOSIS — I10 Essential (primary) hypertension: Secondary | ICD-10-CM

## 2019-03-27 DIAGNOSIS — R7303 Prediabetes: Secondary | ICD-10-CM

## 2019-03-27 MED ORDER — MONTELUKAST SODIUM 10 MG PO TABS: 10.0000 mg | ORAL_TABLET | Freq: Every day | ORAL | 1 refills | Status: DC

## 2019-03-27 MED ORDER — HYDROCHLOROTHIAZIDE 25 MG PO TABS: 12.5000 mg | ORAL_TABLET | Freq: Every day | ORAL | 1 refills | Status: AC

## 2019-03-27 MED ORDER — ATORVASTATIN CALCIUM 20 MG PO TABS: 20.0000 mg | ORAL_TABLET | Freq: Every day | ORAL | 1 refills | Status: DC

## 2019-03-27 MED ORDER — LOSARTAN POTASSIUM 25 MG PO TABS: 25.0000 mg | ORAL_TABLET | Freq: Every day | ORAL | 4 refills | Status: AC

## 2019-03-27 MED ORDER — CETIRIZINE HCL 10 MG PO TABS: 10.0000 mg | ORAL_TABLET | Freq: Every day | ORAL | 2 refills | Status: DC

## 2019-03-27 MED ORDER — MELOXICAM 15 MG PO TABS: ORAL_TABLET | ORAL | 0 refills | Status: DC

## 2019-03-27 MED ORDER — PROPRANOLOL HCL ER 60 MG PO CP24: 60.0000 mg | ORAL_CAPSULE | Freq: Every day | ORAL | 6 refills | Status: AC

## 2019-03-27 NOTE — Progress Notes (Signed)
LMP 12/18/2017 (Approximate)    Subjective:    Patient ID: Carolyn Daniels, female    DOB: 1970-07-08, 49 y.o.   MRN: [id number]  HPI: Carolyn Daniels is a 49 y.o. female presenting on 03/27/2019 for No chief complaint on file.   HPI  This is a telemedicine appointment through [employer] due to coronavirus pandemic.  I connected with  Carolyn Daniels on 03/27/19 by a video enabled telemedicine application and verified that I am speaking with the correct person using two identifiers.   I discussed the limitations of evaluation and management by telemedicine. The patient expressed understanding and agreed to proceed.  Pt is at home.  Provider is at office.   Pt is 49yoF with follow up appointment today for HTN, dyslipidemia and mental health issues.   Pt says she is doing pretty well today.  She has appointment with mental health on 2/16.   Pt has been monitoring her blood pressure at home and she says it is doing well.    Relevant past medical, surgical, family and social history reviewed and updated as indicated. Interim medical history since our last visit reviewed. Allergies and medications reviewed and updated.   Current Outpatient Medications:  .  atorvastatin (LIPITOR) 20 MG tablet, TAKE 1 Tablet BY MOUTH ONCE DAILY, Disp: 90 tablet, Rfl: 1 .  cetirizine (ZYRTEC) 10 MG tablet, Take 1 tablet (10 mg total) by mouth daily., Disp: 90 tablet, Rfl: 0 .  hydrochlorothiazide (HYDRODIURIL) 25 MG tablet, TAKE 1/2 Tablet BY MOUTH EVERY DAY, Disp: 45 tablet, Rfl: 1 .  losartan (COZAAR) 25 MG tablet, Take 1 tablet (25 mg total) by mouth daily., Disp: 30 tablet, Rfl: 4 .  meloxicam (MOBIC) 15 MG tablet, TAKE 1/2 Tablet BY MOUTH ONCE EVERY DAY AS NEEDED FOR PAIN, Disp: 45 tablet, Rfl: 0 .  montelukast (SINGULAIR) 10 MG tablet, Take 1 tablet (10 mg total) by mouth daily., Disp: 90 tablet, Rfl: 1 .  Probiotic Product (PROBIOTIC DAILY PO), Take 1 tablet by mouth daily., Disp:  , Rfl:  .  propranolol ER (INDERAL LA) 60 MG 24 hr capsule, Take 1 capsule by mouth once daily, Disp: 30 capsule, Rfl: 6 .  risperiDONE (RISPERDAL) 1 MG tablet, Take 1 tablet (1 mg total) by mouth at bedtime., Disp: 90 tablet, Rfl: 0 .  clotrimazole-betamethasone (LOTRISONE) cream, Apply 1 application topically 2 (two) times daily. (Patient not taking: Reported on 03/27/2019), Disp: 15 g, Rfl: 0 .  LUTEIN PO, Take 1 tablet by mouth daily., Disp: , Rfl:  .  multivitamin-lutein (OCUVITE-LUTEIN) CAPS capsule, Take 1 capsule by mouth daily., Disp: , Rfl:  .  risperiDONE (RISPERDAL) 1 MG tablet, Take 1 tablet (1 mg total) by mouth at bedtime. (Patient not taking: Reported on 03/27/2019), Disp: 7 tablet, Rfl: 0    Review of Systems  Per HPI unless specifically indicated above     Objective:    LMP 12/18/2017 (Approximate)   Wt Readings from Last 3 Encounters:  11/28/18 190 lb (86.2 kg)  04/10/18 193 lb (87.5 kg)  03/23/18 180 lb (81.6 kg)    Physical Exam Constitutional:      General: She is not in acute distress.    Appearance: Normal appearance. She is not ill-appearing.  HENT:     Head: Normocephalic and atraumatic.  Pulmonary:     Effort: Pulmonary effort is normal. No respiratory distress.  Neurological:     Mental Status: She is alert and oriented to person, place, and  time.  Psychiatric:        Attention and Perception: Attention normal.        Speech: Speech normal.        Behavior: Behavior is cooperative.     Results for orders placed or performed during the hospital encounter of 03/17/19  Hemoglobin A1c  Result Value Ref Range   Hgb A1c MFr Bld 6.0 (H) 4.8 - 5.6 %   Mean Plasma Glucose 125.5 mg/dL  Lipid panel  Result Value Ref Range   Cholesterol 148 0 - 200 mg/dL   Triglycerides 150 (H) <150 mg/dL   HDL 44 >40 mg/dL   Total CHOL/HDL Ratio 3.4 RATIO   VLDL 30 0 - 40 mg/dL   LDL Cholesterol 74 0 - 99 mg/dL  Comprehensive metabolic panel  Result Value Ref Range    Sodium 137 135 - 145 mmol/L   Potassium 3.7 3.5 - 5.1 mmol/L   Chloride 105 98 - 111 mmol/L   CO2 24 22 - 32 mmol/L   Glucose, Bld 88 70 - 99 mg/dL   BUN 17 6 - 20 mg/dL   Creatinine, Ser 0.61 0.44 - 1.00 mg/dL   Calcium 9.2 8.9 - 10.3 mg/dL   Total Protein 7.2 6.5 - 8.1 g/dL   Albumin 3.9 3.5 - 5.0 g/dL   AST 12 (L) 15 - 41 U/L   ALT 16 0 - 44 U/L   Alkaline Phosphatase 82 38 - 126 U/L   Total Bilirubin 0.6 0.3 - 1.2 mg/dL   GFR calc non Af Amer >60 >60 mL/min   GFR calc Af Amer >60 >60 mL/min   Anion gap 8 5 - 15      Assessment & Plan:   Encounter Diagnoses  Name Primary?  . Essential hypertension Yes  . Hyperlipidemia, unspecified hyperlipidemia type   . Prediabetes   . Bipolar affective disorder, remission status unspecified ([place])     -reviewed labs with pt -pt counseled on prediabetes.  She is to watch diet and avoid weight gain -pt to continue current medications -will Give ifobt at next appointment for colon cancer screening -mammogram UTD -pt to continue with mental health per their recommendations -pt to follow up 3 months.  She is to contact office sooner prn

## 2019-04-01 ENCOUNTER — Ambulatory Visit: Payer: Self-pay | Admitting: Psychiatry

## 2019-04-08 ENCOUNTER — Other Ambulatory Visit: Payer: Self-pay | Admitting: Physician Assistant

## 2019-04-08 MED ORDER — CLOTRIMAZOLE-BETAMETHASONE 1-0.05 % EX CREA: 1.0000 "application " | TOPICAL_CREAM | Freq: Two times a day (BID) | CUTANEOUS | 0 refills | Status: AC

## 2019-04-20 ENCOUNTER — Emergency Department (HOSPITAL_COMMUNITY)
Admission: EM | Admit: 2019-04-20 | Discharge: 2019-04-20 | Disposition: A | Discharge status: Home / Self Care | Payer: Self-pay | Attending: Emergency Medicine | Admitting: Emergency Medicine

## 2019-04-20 ENCOUNTER — Emergency Department (HOSPITAL_COMMUNITY): Payer: Self-pay

## 2019-04-20 ENCOUNTER — Encounter (HOSPITAL_COMMUNITY): Payer: Self-pay | Admitting: Emergency Medicine

## 2019-04-20 ENCOUNTER — Other Ambulatory Visit: Payer: Self-pay

## 2019-04-20 DIAGNOSIS — L03032 Cellulitis of left toe: Secondary | ICD-10-CM | POA: Insufficient documentation

## 2019-04-20 DIAGNOSIS — I10 Essential (primary) hypertension: Secondary | ICD-10-CM | POA: Insufficient documentation

## 2019-04-20 DIAGNOSIS — Z79899 Other long term (current) drug therapy: Secondary | ICD-10-CM | POA: Insufficient documentation

## 2019-04-20 DIAGNOSIS — J45909 Unspecified asthma, uncomplicated: Secondary | ICD-10-CM | POA: Insufficient documentation

## 2019-04-20 LAB — CBC WITH DIFFERENTIAL/PLATELET
Abs Immature Granulocytes: 0.02 10*3/uL (ref 0.00–0.07)
Basophils Absolute: 0 10*3/uL (ref 0.0–0.1)
Basophils Relative: 0 %
Eosinophils Absolute: 0.2 10*3/uL (ref 0.0–0.5)
Eosinophils Relative: 2 %
HCT: 38.7 % (ref 36.0–46.0)
Hemoglobin: 12.4 g/dL (ref 12.0–15.0)
Immature Granulocytes: 0 %
Lymphocytes Relative: 28 %
Lymphs Abs: 2.9 10*3/uL (ref 0.7–4.0)
MCH: 30.1 pg (ref 26.0–34.0)
MCHC: 32 g/dL (ref 30.0–36.0)
MCV: 93.9 fL (ref 80.0–100.0)
Monocytes Absolute: 1 10*3/uL (ref 0.1–1.0)
Monocytes Relative: 9 %
Neutro Abs: 6.4 10*3/uL (ref 1.7–7.7)
Neutrophils Relative %: 61 %
Platelets: 380 10*3/uL (ref 150–400)
RBC: 4.12 MIL/uL (ref 3.87–5.11)
RDW: 12.2 % (ref 11.5–15.5)
WBC: 10.6 10*3/uL — ABNORMAL HIGH (ref 4.0–10.5)
nRBC: 0 % (ref 0.0–0.2)

## 2019-04-20 LAB — BASIC METABOLIC PANEL
Anion gap: 12 (ref 5–15)
BUN: 12 mg/dL (ref 6–20)
CO2: 25 mmol/L (ref 22–32)
Calcium: 9 mg/dL (ref 8.9–10.3)
Chloride: 100 mmol/L (ref 98–111)
Creatinine, Ser: 0.7 mg/dL (ref 0.44–1.00)
GFR calc Af Amer: >60 mL/min (ref >60)
GFR calc non Af Amer: >60 mL/min (ref >60)
Glucose, Bld: 147 mg/dL — ABNORMAL HIGH (ref 70–99)
Potassium: 3.5 mmol/L (ref 3.5–5.1)
Sodium: 137 mmol/L (ref 135–145)

## 2019-04-20 MED ORDER — DOXYCYCLINE HYCLATE 100 MG PO TABS
100.0000 mg | ORAL_TABLET | Freq: Once | ORAL | Status: AC
  Administered 2019-04-20: 100 mg via ORAL
  Filled 2019-04-20: qty 1

## 2019-04-20 MED ORDER — DOXYCYCLINE HYCLATE 100 MG PO CAPS: 100.0000 mg | ORAL_CAPSULE | Freq: Two times a day (BID) | ORAL | 0 refills | Status: DC
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
## 2019-04-20 NOTE — ED Triage Notes (Addendum)
Pt presents to ED from home POV. Pt c/o foot for the last week after stubbing her L big toe. Pt says that she has been treating the open wound with antibiotic ointment. No signs of infection until tonight there is some redness. Pt says pain has gotten worse tonight. Pt reports decrease in pain with home meloxicam. Pt says last tetanus was in 2014.

## 2019-04-21 ENCOUNTER — Ambulatory Visit: Payer: Medicaid Other | Admitting: Physician Assistant

## 2019-04-22 ENCOUNTER — Ambulatory Visit: Payer: Medicaid Other | Admitting: Psychiatry

## 2019-04-22 ENCOUNTER — Other Ambulatory Visit: Payer: Self-pay

## 2019-04-22 ENCOUNTER — Ambulatory Visit (HOSPITAL_COMMUNITY): Payer: Self-pay | Admitting: Psychiatry
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
## 2019-04-28 ENCOUNTER — Telehealth (HOSPITAL_COMMUNITY): Payer: Self-pay | Admitting: *Deleted

## 2019-04-28 ENCOUNTER — Encounter (HOSPITAL_COMMUNITY): Payer: Self-pay | Admitting: Psychiatry

## 2019-04-28 ENCOUNTER — Ambulatory Visit (INDEPENDENT_AMBULATORY_CARE_PROVIDER_SITE_OTHER): Payer: Medicaid - Out of State | Admitting: Psychiatry

## 2019-04-28 ENCOUNTER — Other Ambulatory Visit: Payer: Self-pay

## 2019-04-28 DIAGNOSIS — F3175 Bipolar disorder, in partial remission, most recent episode depressed: Secondary | ICD-10-CM

## 2019-04-28 DIAGNOSIS — F4310 Post-traumatic stress disorder, unspecified: Secondary | ICD-10-CM | POA: Diagnosis not present

## 2019-04-28 MED ORDER — RISPERIDONE 1 MG PO TABS: 1.0000 mg | ORAL_TABLET | Freq: Every day | ORAL | 0 refills | Status: DC

## 2019-04-28 MED ORDER — DULOXETINE HCL 20 MG PO CPEP: 20.0000 mg | ORAL_CAPSULE | Freq: Every day | ORAL | 1 refills | Status: DC

## 2019-04-28 NOTE — Patient Instructions (Signed)
1. Start duloxetine 20 mg daily  2. Continue risperidone 1 mg at night  3. Next appointment: 4/6 at 1:30  4. Referral to therapy

## 2019-04-28 NOTE — Telephone Encounter (Signed)
PATIENT CALLED TO LEAVE A WORKING # FOR APPT TODAY

## 2019-04-29 ENCOUNTER — Other Ambulatory Visit: Payer: Self-pay | Admitting: Physician Assistant

## 2019-04-29 ENCOUNTER — Ambulatory Visit: Payer: Medicaid Other | Admitting: Physician Assistant

## 2019-04-29 ENCOUNTER — Encounter: Payer: Self-pay | Admitting: Physician Assistant

## 2019-04-29 DIAGNOSIS — L03032 Cellulitis of left toe: Secondary | ICD-10-CM

## 2019-04-29 MED ORDER — MELOXICAM 15 MG PO TABS: ORAL_TABLET | ORAL | 0 refills | Status: AC

## 2019-04-29 NOTE — Progress Notes (Signed)
LMP 12/18/2017 (Approximate)    Subjective:    Patient ID: Carolyn Daniels, female    DOB: October 24, 1970, 49 y.o.   MRN: [id number]  HPI: Carolyn Daniels is a 49 y.o. female presenting on 04/29/2019 for No chief complaint on file.   HPI   This is a telemedicine appointment through [employer] due to coronavirus pandemic  I connected with  Carolyn Daniels on 04/29/19 by a video enabled telemedicine application and verified that I am speaking with the correct person using two identifiers.   I discussed the limitations of evaluation and management by telemedicine. The patient expressed understanding and agreed to proceed.  Pt is at home.  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     Pt was seen in ER on 04/20/19 and was started on doxycycline for cellulitis of the L great toe.  She says that she has only 1 dose left of this medication.   She says the doxycycline gave her diarrhea.    She Thinks her toe got injured in January maybe by something getting dropped on it.  She says the toes it improved.  It is no longer hurting her.  She has no fevers.  It is not draining.        Relevant past medical, surgical, family and social history reviewed and updated as indicated. Interim medical history since our last visit reviewed. Allergies and medications reviewed and updated.   Current Outpatient Medications:  .  atorvastatin (LIPITOR) 20 MG tablet, Take 1 tablet (20 mg total) by mouth daily., Disp: 90 tablet, Rfl: 1 .  cetirizine (ZYRTEC) 10 MG tablet, Take 1 tablet (10 mg total) by mouth daily., Disp: 90 tablet, Rfl: 2 .  clotrimazole-betamethasone (LOTRISONE) cream, Apply 1 application topically 2 (two) times daily., Disp: 15 g, Rfl: 0 .  doxycycline (VIBRAMYCIN) 100 MG capsule, Take 1 capsule (100 mg total) by mouth 2 (two) times daily. One po bid x 7 days, Disp: 13 capsule, Rfl: 0 .  hydrochlorothiazide (HYDRODIURIL) 25 MG tablet, Take 0.5 tablets (12.5 mg total) by  mouth daily., Disp: 45 tablet, Rfl: 1 .  losartan (COZAAR) 25 MG tablet, Take 1 tablet (25 mg total) by mouth daily., Disp: 30 tablet, Rfl: 4 .  meloxicam (MOBIC) 15 MG tablet, TAKE 1/2 Tablet BY MOUTH ONCE EVERY DAY AS NEEDED FOR PAIN, Disp: 45 tablet, Rfl: 0 .  montelukast (SINGULAIR) 10 MG tablet, Take 1 tablet (10 mg total) by mouth daily., Disp: 90 tablet, Rfl: 1 .  multivitamin-lutein (OCUVITE-LUTEIN) CAPS capsule, Take 1 capsule by mouth daily., Disp: , Rfl:  .  Probiotic Product (PROBIOTIC DAILY PO), Take 1 tablet by mouth daily., Disp: , Rfl:  .  propranolol ER (INDERAL LA) 60 MG 24 hr capsule, Take 1 capsule (60 mg total) by mouth daily., Disp: 30 capsule, Rfl: 6 .  risperiDONE (RISPERDAL) 1 MG tablet, Take 1 tablet (1 mg total) by mouth at bedtime., Disp: 7 tablet, Rfl: 0 .  risperiDONE (RISPERDAL) 1 MG tablet, Take 1 tablet (1 mg total) by mouth at bedtime., Disp: 90 tablet, Rfl: 0 .  DULoxetine (CYMBALTA) 20 MG capsule, Take 1 capsule (20 mg total) by mouth daily. (Patient not taking: Reported on 04/29/2019), Disp: 30 capsule, Rfl: 1 .  LUTEIN PO, Take 1 tablet by mouth daily., Disp: , Rfl:      Review of Systems  Per HPI unless specifically indicated above     Objective:    LMP 12/18/2017 (Approximate)   Wt  Readings from Last 3 Encounters:  11/28/18 190 lb (86.2 kg)  04/10/18 193 lb (87.5 kg)  03/23/18 180 lb (81.6 kg)    Physical Exam Constitutional:      General: She is not in acute distress. HENT:     Head: Normocephalic and atraumatic.  Pulmonary:     Effort: Pulmonary effort is normal. No respiratory distress.  Feet:     Comments: L great toenail is incomplete with a portion missing in the medial distal corner.  There is no redness or swelling of the toe.  No purulence visualized.   Neurological:     Mental Status: She is alert and oriented to person, place, and time.  Psychiatric:        Attention and Perception: Attention normal.        Speech: Speech  normal.        Behavior: Behavior is cooperative.           Assessment & Plan:   Encounter Diagnosis  Name Primary?  . Cellulitis of great toe of left foot Yes    -discussed with pt that cellulitis appears resolved.  Recommend she just not take the last dose of doxy since it is giving her diarrhea.  She is to notify office next week if the diarrhea hasn't resolved.  She is to contact office sooner if it worsens. -pt is counseled on care of the nail including avoiding cleaning it with alcohol or peroxide, avoiding digging in the wound, elevate as needed to decrease throbbing, avoid getting it dirty.   -pt to follow up in April as scheduled.  She is to contact office sooner if needed for any problems

## 2019-05-20 ENCOUNTER — Other Ambulatory Visit: Payer: Self-pay | Admitting: Physician Assistant

## 2019-05-20 MED ORDER — ATORVASTATIN CALCIUM 20 MG PO TABS: 20.0000 mg | ORAL_TABLET | Freq: Every day | ORAL | 1 refills | Status: AC

## 2019-05-20 MED ORDER — CETIRIZINE HCL 10 MG PO TABS: 10.0000 mg | ORAL_TABLET | Freq: Every day | ORAL | 1 refills | Status: AC

## 2019-05-22 ENCOUNTER — Other Ambulatory Visit: Payer: Self-pay | Admitting: Physician Assistant

## 2019-05-22 MED ORDER — MONTELUKAST SODIUM 10 MG PO TABS: 10.0000 mg | ORAL_TABLET | Freq: Every day | ORAL | 1 refills | Status: AC
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
## 2019-06-10 ENCOUNTER — Encounter (HOSPITAL_COMMUNITY): Payer: Self-pay | Admitting: Psychiatry

## 2019-06-10 ENCOUNTER — Other Ambulatory Visit: Payer: Self-pay

## 2019-06-10 ENCOUNTER — Ambulatory Visit (INDEPENDENT_AMBULATORY_CARE_PROVIDER_SITE_OTHER): Payer: Medicaid - Out of State | Admitting: Psychiatry

## 2019-06-10 DIAGNOSIS — F4310 Post-traumatic stress disorder, unspecified: Secondary | ICD-10-CM

## 2019-06-10 DIAGNOSIS — F0630 Mood disorder due to known physiological condition, unspecified: Secondary | ICD-10-CM

## 2019-06-10 MED ORDER — DULOXETINE HCL 20 MG PO CPEP: 20.0000 mg | ORAL_CAPSULE | Freq: Every day | ORAL | 0 refills | Status: AC

## 2019-06-10 MED ORDER — RISPERIDONE 1 MG PO TABS: 1.0000 mg | ORAL_TABLET | Freq: Every day | ORAL | 0 refills | Status: AC

## 2019-06-10 NOTE — Patient Instructions (Signed)
1. Continue duloxetine 20 mg daily  2. Continue risperidone 1 mg at night  3. Next appointment: 6/14 at 11 AM

## 2019-06-27 LAB — TSH: TSH: 1.12 mIU/L

## 2019-06-30 ENCOUNTER — Encounter (HOSPITAL_COMMUNITY): Payer: Self-pay | Admitting: Psychiatry

## 2019-06-30 ENCOUNTER — Ambulatory Visit: Payer: Medicaid Other | Admitting: Physician Assistant

## 2019-07-03 ENCOUNTER — Telehealth (HOSPITAL_COMMUNITY): Payer: Self-pay | Admitting: *Deleted

## 2019-07-03 NOTE — Telephone Encounter (Signed)
??   DON'T KNOW HAVEN'T SEEN LABS COME ACROSS FAX

## 2019-07-03 NOTE — Telephone Encounter (Signed)
PER PROVIDER : Please tell her that we have not received the fax to review SPOKE WITH PATIENT  & SHE WILL CALL [facility] TO REQUEST LABS FAXED TO OUR OFFICE

## 2019-07-03 NOTE — Telephone Encounter (Signed)
Please tell her that we have not received the fax to review.

## 2019-07-03 NOTE — Telephone Encounter (Signed)
PATIENT CALLED STATED SHE HAD THE LAB WORK DONE YOU REQUESTED & HAD RESULT  FAXED TO THE OFFICE FOR YOU TO REVIEW  & ASKED IF YOU COULD GO OVER THE LAB  RESULTS WITH HER? BOTH #'S IN EPIC ARE CORRECT.  PATIENT STATES THEY WILL BE  MOVING TO [place] & THANKED YOU FOR ALL YOUR HELP

## 2019-07-09 ENCOUNTER — Ambulatory Visit: Payer: Medicaid Other | Admitting: Physician Assistant

## 2019-08-05 ENCOUNTER — Telehealth (HOSPITAL_COMMUNITY): Payer: Self-pay | Admitting: *Deleted

## 2019-08-05 NOTE — Telephone Encounter (Signed)
Patient called and would like a referral to Minda Meo with the [facility] in [facility] phone number [phone] and fax number is [phone] .   Per pt she moved to [facility]

## 2019-08-11 NOTE — Telephone Encounter (Signed)
Please contact the facility to ask if they need any specific information for this referral. Please fax the record as needed after obtaining consent form from the patient.

## 2019-08-14 ENCOUNTER — Encounter (HOSPITAL_COMMUNITY): Payer: Self-pay | Admitting: Psychiatry

## 2019-08-14 NOTE — Telephone Encounter (Signed)
Dr. Vanetta Shawl, I called the facility pt is requesting referral for, they advised they would need a letter advising why the patient is in need of referral along with office notes and demographics. I can gather notes and demographics and fax this information once I get the letter from you.

## 2019-08-14 NOTE — Telephone Encounter (Signed)
Done. Please send this after obtaining consent form from the patient.

## 2019-08-18 ENCOUNTER — Ambulatory Visit (HOSPITAL_COMMUNITY): Payer: Self-pay | Admitting: Psychiatry

## 2020-07-18 IMAGING — DX DG FOOT COMPLETE 3+V*L*
3 series · 3 of 3 positions shown · non-contrast
Comparison: None.

CLINICAL DATA: Injury with open wound of the great toe.

EXAM:
LEFT FOOT - COMPLETE 3+ VIEW

[foot ap]
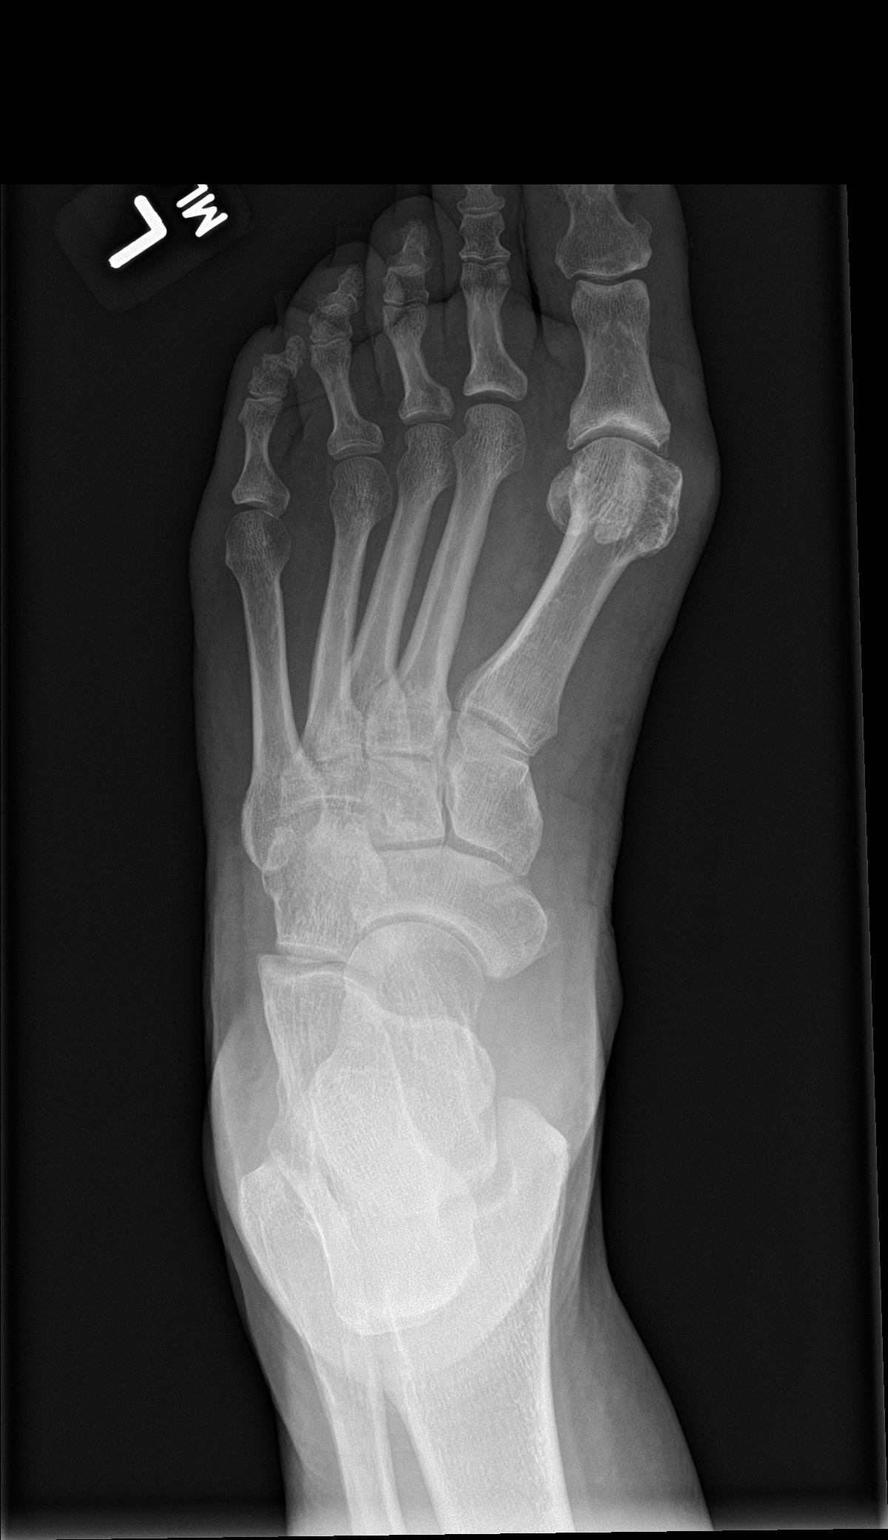

[foot obl]
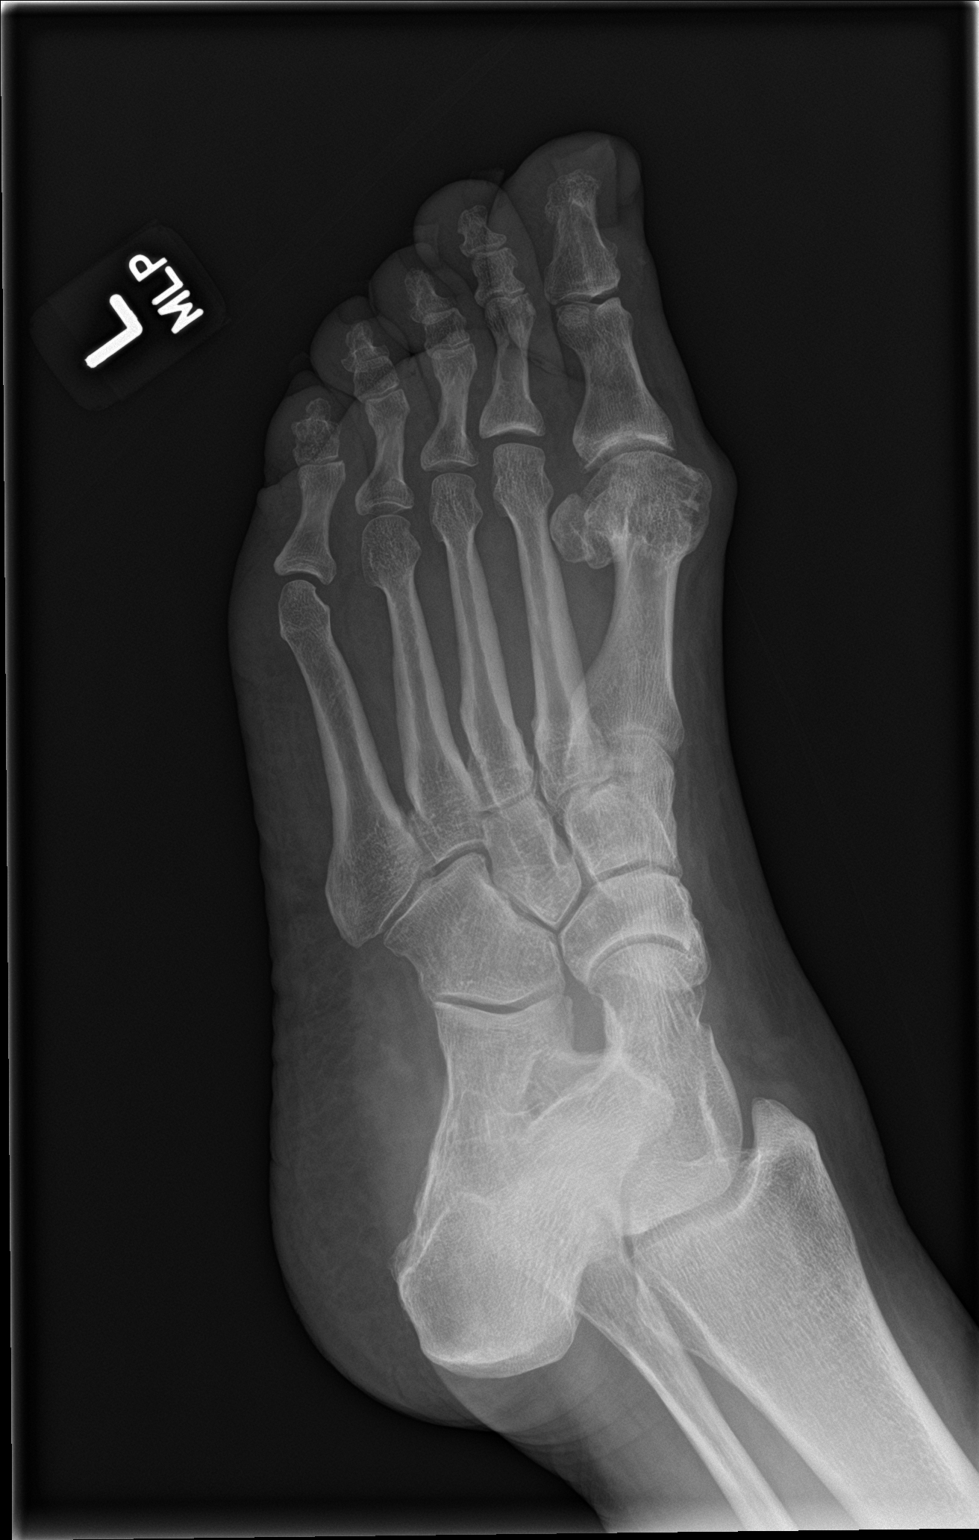

[foot lat]
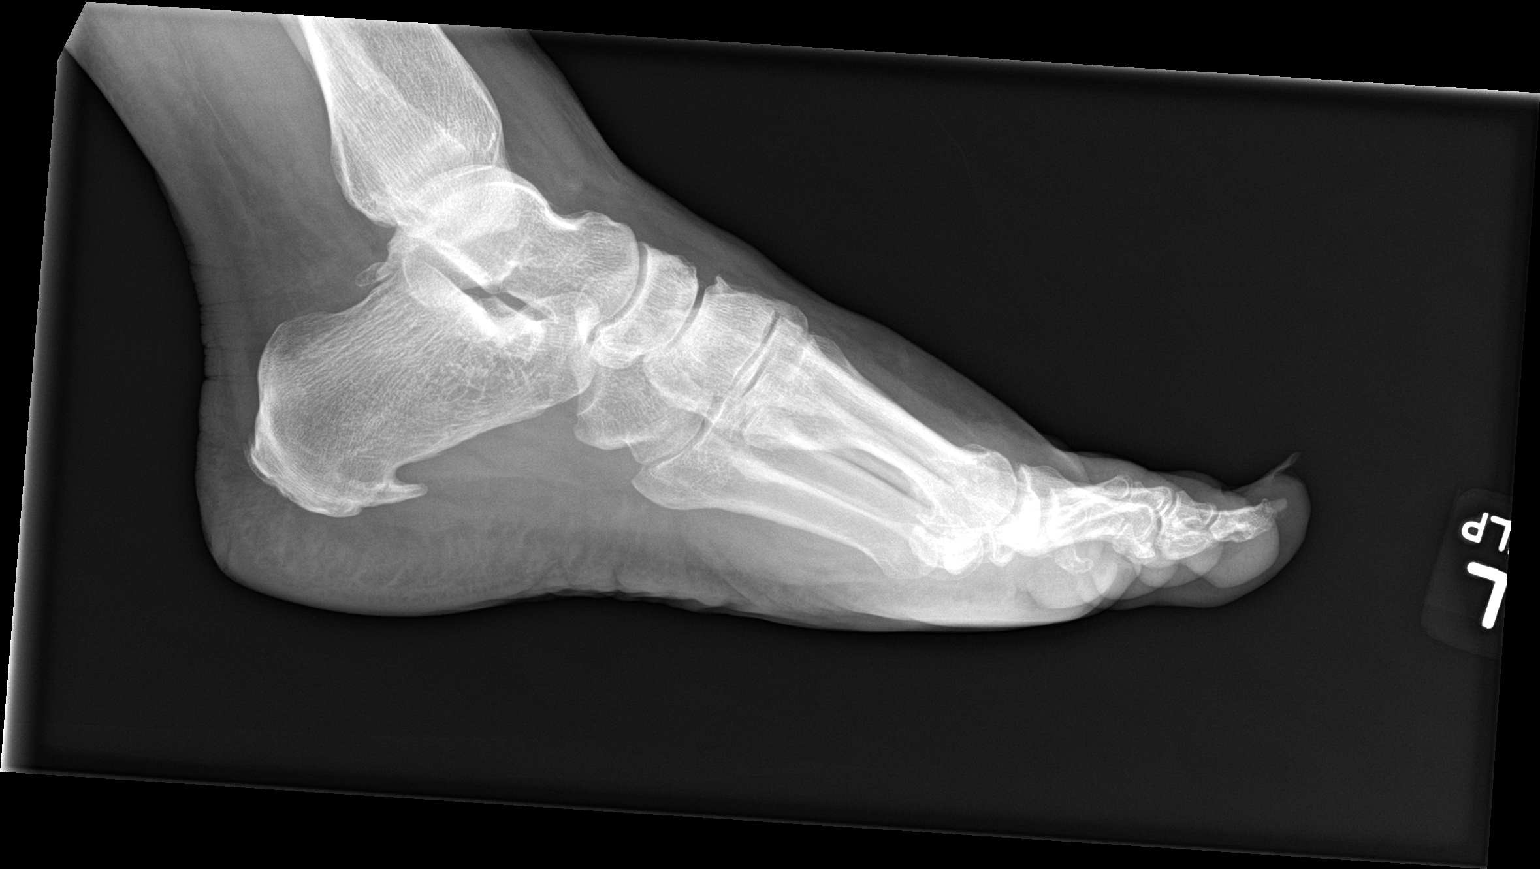

[3 of 3 positions shown; findings below may reference images not displayed]

FINDINGS: No evidence of fracture, dislocation, radiopaque foreign object or
evidence of osteomyelitis. Bunion deformity with hallux valgus of
the MTP joint of the great toe.
IMPRESSION: No acute radiographic finding. Hallux valgus and bunion deformity of
the MTP joint of the great toe.
